# Patient Record
Sex: Female | Born: 1960 | Race: White | Hispanic: No | Marital: Married | Smoking: Former smoker
Health system: Southern US, Community
[De-identification: ages and names within clinical notes are randomized; demographics above are authoritative.]

## PROBLEM LIST (undated history)

## (undated) DIAGNOSIS — I10 Essential (primary) hypertension: Secondary | ICD-10-CM

## (undated) DIAGNOSIS — K219 Gastro-esophageal reflux disease without esophagitis: Secondary | ICD-10-CM

## (undated) HISTORY — DX: Essential (primary) hypertension: I10

## (undated) HISTORY — DX: Gastro-esophageal reflux disease without esophagitis: K21.9

---

## 2000-10-30 ENCOUNTER — Encounter: Admission: RE | Admit: 2000-10-30 | Discharge: 2000-10-30 | Payer: Self-pay | Admitting: Family Medicine

## 2000-10-30 ENCOUNTER — Encounter: Payer: Self-pay | Admitting: Family Medicine

## 2001-04-06 ENCOUNTER — Emergency Department (HOSPITAL_COMMUNITY): Admission: EM | Admit: 2001-04-06 | Discharge: 2001-04-06 | Payer: Self-pay | Admitting: Emergency Medicine

## 2002-05-26 ENCOUNTER — Encounter: Payer: Self-pay | Admitting: Family Medicine

## 2002-05-26 ENCOUNTER — Encounter: Admission: RE | Admit: 2002-05-26 | Discharge: 2002-05-26 | Payer: Self-pay | Admitting: Family Medicine

## 2005-12-19 ENCOUNTER — Other Ambulatory Visit: Admission: RE | Admit: 2005-12-19 | Discharge: 2005-12-19 | Payer: Self-pay | Admitting: Family Medicine

## 2011-01-31 ENCOUNTER — Other Ambulatory Visit (HOSPITAL_COMMUNITY)
Admission: RE | Admit: 2011-01-31 | Discharge: 2011-01-31 | Disposition: A | Discharge status: Home / Self Care | Payer: BC Managed Care – PPO | Source: Ambulatory Visit | Attending: Family Medicine | Admitting: Family Medicine

## 2011-01-31 ENCOUNTER — Other Ambulatory Visit: Payer: Self-pay | Admitting: Family Medicine

## 2011-01-31 DIAGNOSIS — Z124 Encounter for screening for malignant neoplasm of cervix: Secondary | ICD-10-CM | POA: Insufficient documentation

## 2011-01-31 DIAGNOSIS — Z1159 Encounter for screening for other viral diseases: Secondary | ICD-10-CM | POA: Insufficient documentation

## 2011-03-14 ENCOUNTER — Other Ambulatory Visit: Payer: Self-pay | Admitting: Otolaryngology

## 2011-03-19 ENCOUNTER — Other Ambulatory Visit: Payer: BC Managed Care – PPO

## 2013-10-22 ENCOUNTER — Other Ambulatory Visit: Payer: Self-pay

## 2013-10-22 DIAGNOSIS — Z1231 Encounter for screening mammogram for malignant neoplasm of breast: Secondary | ICD-10-CM

## 2013-10-30 ENCOUNTER — Ambulatory Visit
Admission: RE | Admit: 2013-10-30 | Discharge: 2013-10-30 | Disposition: A | Discharge status: Home / Self Care | Payer: BC Managed Care – PPO | Source: Ambulatory Visit

## 2013-10-30 DIAGNOSIS — Z1231 Encounter for screening mammogram for malignant neoplasm of breast: Secondary | ICD-10-CM

## 2017-02-14 ENCOUNTER — Encounter (INDEPENDENT_AMBULATORY_CARE_PROVIDER_SITE_OTHER): Payer: Self-pay

## 2017-02-22 ENCOUNTER — Other Ambulatory Visit: Payer: Self-pay | Admitting: Family Medicine

## 2017-02-22 DIAGNOSIS — R1011 Right upper quadrant pain: Secondary | ICD-10-CM

## 2017-02-25 ENCOUNTER — Ambulatory Visit (INDEPENDENT_AMBULATORY_CARE_PROVIDER_SITE_OTHER): Payer: BC Managed Care – PPO | Admitting: Family Medicine

## 2017-02-25 ENCOUNTER — Encounter (INDEPENDENT_AMBULATORY_CARE_PROVIDER_SITE_OTHER): Payer: Self-pay | Admitting: Family Medicine

## 2017-02-25 VITALS — BP 110/78 | HR 77 | Temp 97.6°F | Ht 62.0 in | Wt 264.0 lb

## 2017-02-25 DIAGNOSIS — R0602 Shortness of breath: Secondary | ICD-10-CM

## 2017-02-25 DIAGNOSIS — Z1331 Encounter for screening for depression: Secondary | ICD-10-CM | POA: Diagnosis not present

## 2017-02-25 DIAGNOSIS — R7303 Prediabetes: Secondary | ICD-10-CM | POA: Diagnosis not present

## 2017-02-25 DIAGNOSIS — R252 Cramp and spasm: Secondary | ICD-10-CM

## 2017-02-25 DIAGNOSIS — R5383 Other fatigue: Secondary | ICD-10-CM | POA: Diagnosis not present

## 2017-02-25 DIAGNOSIS — Z6841 Body Mass Index (BMI) 40.0 and over, adult: Secondary | ICD-10-CM

## 2017-02-25 DIAGNOSIS — E66813 Obesity, class 3: Secondary | ICD-10-CM

## 2017-02-25 DIAGNOSIS — Z9189 Other specified personal risk factors, not elsewhere classified: Secondary | ICD-10-CM

## 2017-02-25 DIAGNOSIS — Z0289 Encounter for other administrative examinations: Secondary | ICD-10-CM

## 2017-02-25 NOTE — Progress Notes (Signed)
.  Office: [phone]  /  Fax: [phone]   HPI:   Chief Complaint: OBESITY  Tracy Beard (MR# [id number]) is a 57 y.o. female who presents on 02/25/2017 for obesity evaluation and treatment. Current BMI is Body mass index is 48.29 kg/m.Marland Kitchen Tracy Beard has struggled with obesity for years and has been unsuccessful in either losing weight or maintaining long term weight loss. Tracy Beard attended our information session and states she is currently in the action stage of change and ready to dedicate time achieving and maintaining a healthier weight.  Tracy Beard has lost approximately 50 lbs over the last few years.   Tracy Beard states her family eats meals together she thinks her family will eat healthier with  her her desired weight loss is 134-144 lbs she has been heavy most of  her life she started gaining weight in high school her heaviest weight ever was 315 lbs. she is a picky eater and doesn't like to eat healthier foods  she has significant food cravings issues  she snacks frequently in the evenings she is frequently drinking liquids with calories she frequently makes poor food choices she has problems with excessive hunger  she frequently eats larger portions than normal  she struggles with emotional eating    Fatigue Tracy Beard feels her energy is lower than it should be. This has worsened with weight gain and has not worsened recently. Tracy Beard admits to daytime somnolence and  denies waking up still tired. Patient is at risk for obstructive sleep apnea. Patent has a history of symptoms of daytime fatigue. Patient generally gets 5 hours of sleep per night, and states they generally have generally restful sleep. Snoring is not present. Apneic episodes are present. Epworth Sleepiness Score is 20.  Dyspnea on exertion Tracy Beard notes increasing shortness of breath with exercising and seems to be worsening over time with weight gain. She notes getting out of breath sooner with  activity than she used to. This has not gotten worse recently. Tracy Beard denies orthopnea.  Pre-Diabetes Tracy Beard has a diagnosis of pre-diabetes based on her elevated Hgb A1c and was informed this puts her at greater risk of developing diabetes. She is not taking metformin currently and attempting to diet control and exercise to decrease risk of diabetes. She notes polyphagia and denies nausea or hypoglycemia.  At risk for diabetes Tracy Beard is at higher than average risk for developing diabetes due to her obesity and pre-diabetes. She currently denies polyuria or polydipsia.  Bilateral Foot Cramping Tracy Beard is on KCI and magnesium but she is still having cramping pain in feet. She notes it is worse in the evening and symptoms present for months.  Depression Screen Tracy Beard's Food and Mood (modified PHQ-9) score was  Depression screen PHQ 2/9 02/25/2017  Decreased Interest 2  Down, Depressed, Hopeless 0  PHQ - 2 Score 2  Altered sleeping 1  Tired, decreased energy 2  Change in appetite 1  Feeling bad or failure about yourself  0  Trouble concentrating 0  Moving slowly or fidgety/restless 0  PHQ-9 Score 6  Difficult doing work/chores Not difficult at all    ALLERGIES: Not on File  MEDICATIONS: Current Outpatient Medications on File Prior to Visit  Medication Sig Dispense Refill  . Calcium Carbonate-Vitamin D (CALCIUM 600+D PO) Take 2 tablets by mouth daily.    Marland Kitchen esomeprazole (NEXIUM) 40 MG capsule Take 40 mg by mouth daily at 12 noon.    . magnesium gluconate (MAGONATE) 500 MG tablet Take 500 mg by mouth daily.    Marland Kitchen  Multiple Vitamins-Minerals (MULTIVITAMIN ADULT PO) Take 1 tablet by mouth daily.    . potassium gluconate 595 (99 K) MG TABS tablet Take 595 mg by mouth daily.    . SUPER B COMPLEX/C PO Take 1 tablet by mouth daily.    . vitamin B-12 (CYANOCOBALAMIN) 100 MCG tablet Take 100 mcg by mouth daily.     No current facility-administered medications on file prior to visit.      PAST MEDICAL HISTORY: Past Medical History:  Diagnosis Date  . Acid reflux   . HTN (hypertension)     PAST SURGICAL HISTORY: History reviewed. No pertinent surgical history.  SOCIAL HISTORY: Social History   Tobacco Use  . Smoking status: Former Smoker    Packs/day: 1.00    Years: 24.00    Pack years: 24.00    Last attempt to quit: 1997    Years since quitting: 22.0  . Smokeless tobacco: Never Used  Substance Use Topics  . Alcohol use: No    Frequency: Never  . Drug use: No    FAMILY HISTORY: Family History  Problem Relation Age of Onset  . Hypertension Mother   . Hypertension Father     ROS: Review of Systems  Constitutional: Positive for malaise/fatigue. Negative for weight loss.  HENT:       Decreased hearing  Eyes:       Wear glasses or contacts  Respiratory: Positive for shortness of breath.   Cardiovascular: Negative for orthopnea.       Foot cramping (bilateral)  Gastrointestinal: Negative for nausea.  Genitourinary: Negative for frequency.  Musculoskeletal:       Muscle or joint pain  Endo/Heme/Allergies: Negative for polydipsia.       Positive polyphagia Negative hypoglycemia  Psychiatric/Behavioral: Positive for depression. Negative for suicidal ideas.    PHYSICAL EXAM: Blood pressure 110/78, pulse 77, temperature 97.6 F (36.4 C), temperature source Oral, height 5\' 2"  (1.575 m), weight 264 lb (119.7 kg), SpO2 99 %. Body mass index is 48.29 kg/m. Physical Exam  Constitutional: She is oriented to person, place, and time. She appears well-developed and well-nourished.  HENT:  Head: Normocephalic and atraumatic.  Nose: Nose normal.  Eyes: EOM are normal. No scleral icterus.  Neck: Normal range of motion. Neck supple. No thyromegaly present.  Cardiovascular: Normal rate and regular rhythm.  Pulmonary/Chest: Effort normal. No respiratory distress.  Abdominal: Soft. There is no tenderness.  + Obesity  Musculoskeletal:  Range of Motion  normal in all 4 extremities Trace edema noted in bilateral lower extremities  Neurological: She is alert and oriented to person, place, and time. Coordination normal.  Skin: Skin is warm and dry.  Psychiatric: She has a normal mood and affect. Her behavior is normal.  Vitals reviewed.   RECENT LABS AND TESTS: BMET No results found for: NA, K, CL, CO2, GLUCOSE, BUN, CREATININE, CALCIUM, GFRNONAA, GFRAA No results found for: HGBA1C No results found for: INSULIN CBC No results found for: WBC, RBC, HGB, HCT, PLT, MCV, MCH, MCHC, RDW, LYMPHSABS, MONOABS, EOSABS, BASOSABS Iron/TIBC/Ferritin/ %Sat No results found for: IRON, TIBC, FERRITIN, IRONPCTSAT Lipid Panel  No results found for: CHOL, TRIG, HDL, CHOLHDL, VLDL, LDLCALC, LDLDIRECT Hepatic Function Panel  No results found for: PROT, ALBUMIN, AST, ALT, ALKPHOS, BILITOT, BILIDIR, IBILI No results found for: TSH Vitamin D No recent labs  ECG  shows NSR with a rate of 77 BPM INDIRECT CALORIMETER done today shows a VO2 of 255 and a REE of 1777. Her calculated basal metabolic  rate is 1756 thus her basal metabolic rate is better than expected.    ASSESSMENT AND PLAN: Other fatigue - Plan: EKG 12-Lead, Vitamin B12, CBC With Differential, Folate, Lipid Panel With LDL/HDL Ratio, T3, T4, free, TSH, VITAMIN D 25 Hydroxy (Vit-D Deficiency, Fractures)  Shortness of breath on exertion - Plan: CBC With Differential, Lipid Panel With LDL/HDL Ratio  Prediabetes - Plan: Comprehensive metabolic panel, Insulin, random, Hemoglobin A1c  Foot cramps - Bilateral - Plan: Magnesium  Depression screening  At risk for diabetes mellitus  Class 3 severe obesity with serious comorbidity and body mass index (BMI) of 45.0 to 49.9 in adult, unspecified obesity type (HCC)  PLAN:  Fatigue Tracy JohnsKathleen was informed that her fatigue may be related to obesity, depression or many other causes. Labs will be ordered, and in the meanwhile Tracy JohnsKathleen has agreed to work  on diet, exercise and weight loss to help with fatigue. Proper sleep hygiene was discussed including the need for 7-8 hours of quality sleep each night. A sleep study was not ordered based on symptoms and Epworth score.  Dyspnea on exertion Tracy Beard's shortness of breath appears to be obesity related and exercise induced. She has agreed to work on weight loss and gradually increase exercise to treat her exercise induced shortness of breath. If Tracy JohnsKathleen follows our instructions and loses weight without improvement of her shortness of breath, we will plan to refer to pulmonology. We will monitor this condition regularly. Tracy JohnsKathleen agrees to this plan.  Pre-Diabetes Tracy JohnsKathleen will continue to work on weight loss, diet, exercise, and decreasing simple carbohydrates in her diet to help decrease the risk of diabetes. We dicussed metformin including benefits and risks. She was informed that eating too many simple carbohydrates or too many calories at one sitting increases the likelihood of GI side effects. Tracy JohnsKathleen declined metformin for now and a prescription was not written today. Tracy JohnsKathleen agreed to follow up with us as directed to monitor her progress. We will check labs and Tracy JohnsKathleen agrees to follow up with our clinic in 2 weeks.  Diabetes risk counselling Tracy JohnsKathleen was given extended (15 minutes) diabetes prevention counseling today. She is 57 y.o. female and has risk factors for diabetes including obesity and pre-diabetes. We discussed intensive lifestyle modifications today with an emphasis on weight loss as well as increasing exercise and decreasing simple carbohydrates in her diet.  Bilateral Foot Cramping Tracy JohnsKathleen was advised to increase H20 intake. We will check labs and Tracy JohnsKathleen agrees to follow up with our clinic in 2 weeks.  Depression Screen Tracy JohnsKathleen had a mildly positive depression screening. Depression is commonly associated with obesity and often results in emotional eating behaviors. We will  monitor this closely and work on CBT to help improve the non-hunger eating patterns. Referral to Psychology may be required if no improvement is seen as she continues in our clinic.  Obesity Tracy JohnsKathleen is currently in the action stage of change and her goal is to continue with weight loss efforts She has agreed to follow the Category 3 plan Tracy JohnsKathleen has been instructed to work up to a goal of 150 minutes of combined cardio and strengthening exercise per week for weight loss and overall health benefits. We discussed the following Behavioral Modification Strategies today: increasing lean protein intake and decreasing simple carbohydrates   Tracy JohnsKathleen has agreed to follow up with our clinic in 2 weeks. She was informed of the importance of frequent follow up visits to maximize her success with intensive lifestyle modifications for her multiple health conditions.  She was informed we would discuss her lab results at her next visit unless there is a critical issue that needs to be addressed sooner. Tracy Beard agreed to keep her next visit at the agreed upon time to discuss these results.    OBESITY BEHAVIORAL INTERVENTION VISIT  Today's visit was # 1 out of 22.  Starting weight: 264 lbs Starting date: 02/25/17 Today's weight : 264 lbs  Today's date: 02/25/2017 Total lbs lost to date: 0 (Patients must lose 7 lbs in the first 6 months to continue with counseling)   ASK: We discussed the diagnosis of obesity with [name] today and Tracy Beard agreed to give [place] permission to discuss obesity behavioral modification therapy today.  ASSESS: Tracy Beard has the diagnosis of obesity and her BMI today is 48.27 Tracy Beard is in the action stage of change   ADVISE: Tracy Beard was educated on the multiple health risks of obesity as well as the benefit of weight loss to improve her health. She was advised of the need for long term treatment and the importance of lifestyle modifications.  AGREE: Multiple  dietary modification options and treatment options were discussed and  Tracy Beard agreed to the above obesity treatment plan.   I, Burt Knack, am acting as transcriptionist for Quillian Quince, MD   I have reviewed the above documentation for accuracy and completeness, and I agree with the above. -Quillian Quince, MD

## 2017-02-26 ENCOUNTER — Encounter (INDEPENDENT_AMBULATORY_CARE_PROVIDER_SITE_OTHER): Payer: Self-pay | Admitting: Family Medicine

## 2017-02-26 LAB — CBC WITH DIFFERENTIAL
Basophils Absolute: 0 10*3/uL (ref 0.0–0.2)
Basos: 0 %
EOS (ABSOLUTE): 0.2 10*3/uL (ref 0.0–0.4)
Eos: 2 %
Hematocrit: 38.3 % (ref 34.0–46.6)
Hemoglobin: 13 g/dL (ref 11.1–15.9)
Immature Grans (Abs): 0 10*3/uL (ref 0.0–0.1)
Immature Granulocytes: 0 %
Lymphocytes Absolute: 2.1 10*3/uL (ref 0.7–3.1)
Lymphs: 23 %
MCH: 31.3 pg (ref 26.6–33.0)
MCHC: 33.9 g/dL (ref 31.5–35.7)
MCV: 92 fL (ref 79–97)
Monocytes Absolute: 0.5 10*3/uL (ref 0.1–0.9)
Monocytes: 5 %
Neutrophils Absolute: 6.7 10*3/uL (ref 1.4–7.0)
Neutrophils: 70 %
RBC: 4.16 x10E6/uL (ref 3.77–5.28)
RDW: 13.1 % (ref 12.3–15.4)
WBC: 9.5 10*3/uL (ref 3.4–10.8)

## 2017-02-26 LAB — COMPREHENSIVE METABOLIC PANEL
ALT: 10 IU/L (ref 0–32)
AST: 8 IU/L (ref 0–40)
Albumin/Globulin Ratio: 1.7 (ref 1.2–2.2)
Albumin: 4.1 g/dL (ref 3.5–5.5)
Alkaline Phosphatase: 74 IU/L (ref 39–117)
BUN/Creatinine Ratio: 20 (ref 9–23)
BUN: 24 mg/dL (ref 6–24)
Bilirubin Total: <0.2 mg/dL (ref 0.0–1.2)
CO2: 23 mmol/L (ref 20–29)
Calcium: 9.7 mg/dL (ref 8.7–10.2)
Chloride: 98 mmol/L (ref 96–106)
Creatinine, Ser: 1.21 mg/dL — ABNORMAL HIGH (ref 0.57–1.00)
GFR calc Af Amer: 57 mL/min/{1.73_m2} — ABNORMAL LOW (ref >59)
GFR calc non Af Amer: 49 mL/min/{1.73_m2} — ABNORMAL LOW (ref >59)
Globulin, Total: 2.4 g/dL (ref 1.5–4.5)
Glucose: 98 mg/dL (ref 65–99)
Potassium: 5.6 mmol/L — ABNORMAL HIGH (ref 3.5–5.2)
Sodium: 137 mmol/L (ref 134–144)
Total Protein: 6.5 g/dL (ref 6.0–8.5)

## 2017-02-26 LAB — LIPID PANEL WITH LDL/HDL RATIO
Cholesterol, Total: 152 mg/dL (ref 100–199)
HDL: 50 mg/dL (ref >39)
LDL Calculated: 83 mg/dL (ref 0–99)
LDl/HDL Ratio: 1.7 ratio (ref 0.0–3.2)
Triglycerides: 96 mg/dL (ref 0–149)
VLDL Cholesterol Cal: 19 mg/dL (ref 5–40)

## 2017-02-26 LAB — HEMOGLOBIN A1C
Est. average glucose Bld gHb Est-mCnc: 105 mg/dL
Hgb A1c MFr Bld: 5.3 % (ref 4.8–5.6)

## 2017-02-26 LAB — VITAMIN B12: Vitamin B-12: 724 pg/mL (ref 232–1245)

## 2017-02-26 LAB — MAGNESIUM: Magnesium: 1.9 mg/dL (ref 1.6–2.3)

## 2017-02-26 LAB — VITAMIN D 25 HYDROXY (VIT D DEFICIENCY, FRACTURES): Vit D, 25-Hydroxy: 46.9 ng/mL (ref 30.0–100.0)

## 2017-02-26 LAB — T4, FREE: Free T4: 1.1 ng/dL (ref 0.82–1.77)

## 2017-02-26 LAB — T3: T3, Total: 101 ng/dL (ref 71–180)

## 2017-02-26 LAB — FOLATE: Folate: >20 ng/mL (ref >3.0)

## 2017-02-26 LAB — INSULIN, RANDOM: INSULIN: 13.3 u[IU]/mL (ref 2.6–24.9)

## 2017-02-26 LAB — TSH: TSH: 3.38 u[IU]/mL (ref 0.450–4.500)

## 2017-03-03 ENCOUNTER — Encounter (INDEPENDENT_AMBULATORY_CARE_PROVIDER_SITE_OTHER): Payer: Self-pay | Admitting: Family Medicine

## 2017-03-04 ENCOUNTER — Ambulatory Visit
Admission: RE | Admit: 2017-03-04 | Discharge: 2017-03-04 | Disposition: A | Discharge status: Home / Self Care | Payer: BC Managed Care – PPO | Source: Ambulatory Visit | Attending: Family Medicine | Admitting: Family Medicine

## 2017-03-04 DIAGNOSIS — R1011 Right upper quadrant pain: Secondary | ICD-10-CM

## 2017-03-04 MED ORDER — IOPAMIDOL (ISOVUE-300) INJECTION 61%: 100.0000 mL | Freq: Once | INTRAVENOUS | Status: DC | PRN

## 2017-03-11 ENCOUNTER — Ambulatory Visit (INDEPENDENT_AMBULATORY_CARE_PROVIDER_SITE_OTHER): Payer: BC Managed Care – PPO | Admitting: Family Medicine

## 2017-03-11 VITALS — BP 113/76 | HR 82 | Temp 97.6°F | Ht 62.0 in | Wt 255.0 lb

## 2017-03-11 DIAGNOSIS — E8881 Metabolic syndrome: Secondary | ICD-10-CM | POA: Insufficient documentation

## 2017-03-11 DIAGNOSIS — E875 Hyperkalemia: Secondary | ICD-10-CM | POA: Diagnosis not present

## 2017-03-11 DIAGNOSIS — Z9189 Other specified personal risk factors, not elsewhere classified: Secondary | ICD-10-CM | POA: Diagnosis not present

## 2017-03-11 DIAGNOSIS — Z6841 Body Mass Index (BMI) 40.0 and over, adult: Secondary | ICD-10-CM | POA: Diagnosis not present

## 2017-03-11 DIAGNOSIS — N189 Chronic kidney disease, unspecified: Secondary | ICD-10-CM | POA: Diagnosis not present

## 2017-03-11 NOTE — Progress Notes (Signed)
Office: [phone]  /  Fax: [phone]   HPI:   Chief Complaint: OBESITY Tracy JohnsKathleen is here to discuss her progress with her obesity treatment plan. She is on the Category 3 plan and is following her eating plan approximately 100 % of the time. She states she is exercising 0 minutes 0 times per week. Tracy JohnsKathleen did very well with weight loss, but is not always eating all her food, especially at dinner. She likes the structure of the plan and hunger is controlled. Her weight is 255 lb (115.7 kg) today and has had a weight loss of 9 pounds over a period of 2 weeks since her last visit. She has lost 9 lbs since starting treatment with us.  Elevated Potassium Tracy JohnsKathleen was on potassium supplement for muscle cramps, and has already stopped this when she saw her lab results.  Chronic Kidney Disease Tracy JohnsKathleen has a diagnosis of chronic kidney disease with GFR below 60. She is on Lisinopril/HCTZ, but forgot to list this medication and problem on her new patient paperwork.  Insulin Resistance Tracy JohnsKathleen has a new diagnosis of insulin resistance. Her A1c and glucose are within normal limits, but her fasting insulin is elevated.  Although Tracy Beard's blood glucose readings are still under good control, insulin resistance puts her at greater risk of metabolic syndrome and diabetes. She is not taking metformin currently and continues to work on diet and exercise to decrease risk of diabetes.  At risk for diabetes Tracy JohnsKathleen is at higher than average risk for developing diabetes due to her obesity and insulin resistance. She currently denies polyuria or polydipsia.  ALLERGIES: Not on File  MEDICATIONS: Current Outpatient Medications on File Prior to Visit  Medication Sig Dispense Refill  . Calcium Carbonate-Vitamin D (CALCIUM 600+D PO) Take 2 tablets by mouth daily.    Marland Kitchen. esomeprazole (NEXIUM) 40 MG capsule Take 40 mg by mouth daily at 12 noon.    . magnesium gluconate (MAGONATE) 500 MG tablet Take 500  mg by mouth daily.    . Multiple Vitamins-Minerals (MULTIVITAMIN ADULT PO) Take 1 tablet by mouth daily.    . SUPER B COMPLEX/C PO Take 1 tablet by mouth daily.    . vitamin B-12 (CYANOCOBALAMIN) 100 MCG tablet Take 100 mcg by mouth daily.     No current facility-administered medications on file prior to visit.     PAST MEDICAL HISTORY: Past Medical History:  Diagnosis Date  . Acid reflux   . HTN (hypertension)     PAST SURGICAL HISTORY: No past surgical history on file.  SOCIAL HISTORY: Social History   Tobacco Use  . Smoking status: Former Smoker    Packs/day: 1.00    Years: 24.00    Pack years: 24.00    Last attempt to quit: 1997    Years since quitting: 22.1  . Smokeless tobacco: Never Used  Substance Use Topics  . Alcohol use: No    Frequency: Never  . Drug use: No    FAMILY HISTORY: Family History  Problem Relation Age of Onset  . Hypertension Mother   . Hypertension Father     ROS: Review of Systems  Constitutional: Positive for weight loss.  Genitourinary: Negative for frequency.  Musculoskeletal:       Positive for muscle cramps  Endo/Heme/Allergies: Negative for polydipsia.    PHYSICAL EXAM: Blood pressure 113/76, pulse 82, temperature 97.6 F (36.4 C), temperature source Oral, height 5\' 2"  (1.575 m), weight 255 lb (115.7 kg), SpO2 98 %. Body mass index is  46.64 kg/m. Physical Exam  Constitutional: She is oriented to person, place, and time. She appears well-developed and well-nourished.  Cardiovascular: Normal rate.  Pulmonary/Chest: Effort normal.  Musculoskeletal: Normal range of motion.  Neurological: She is oriented to person, place, and time.  Skin: Skin is warm and dry.  Psychiatric: She has a normal mood and affect. Her behavior is normal.  Vitals reviewed.   RECENT LABS AND TESTS: BMET    Component Value Date/Time   NA 137 02/25/2017 0938   K 5.6 (H) 02/25/2017 0938   CL 98 02/25/2017 0938   CO2 23 02/25/2017 0938   GLUCOSE  98 02/25/2017 0938   BUN 24 02/25/2017 0938   CREATININE 1.21 (H) 02/25/2017 0938   CALCIUM 9.7 02/25/2017 0938   GFRNONAA 49 (L) 02/25/2017 0938   GFRAA 57 (L) 02/25/2017 0938   Lab Results  Component Value Date   HGBA1C 5.3 02/25/2017   Lab Results  Component Value Date   INSULIN 13.3 02/25/2017   CBC    Component Value Date/Time   WBC 9.5 02/25/2017 0938   RBC 4.16 02/25/2017 0938   HGB 13.0 02/25/2017 0938   HCT 38.3 02/25/2017 0938   MCV 92 02/25/2017 0938   MCH 31.3 02/25/2017 0938   MCHC 33.9 02/25/2017 0938   RDW 13.1 02/25/2017 0938   LYMPHSABS 2.1 02/25/2017 0938   EOSABS 0.2 02/25/2017 0938   BASOSABS 0.0 02/25/2017 0938   Iron/TIBC/Ferritin/ %Sat No results found for: IRON, TIBC, FERRITIN, IRONPCTSAT Lipid Panel     Component Value Date/Time   CHOL 152 02/25/2017 0938   TRIG 96 02/25/2017 0938   HDL 50 02/25/2017 0938   LDLCALC 83 02/25/2017 0938   Hepatic Function Panel     Component Value Date/Time   PROT 6.5 02/25/2017 0938   ALBUMIN 4.1 02/25/2017 0938   AST 8 02/25/2017 0938   ALT 10 02/25/2017 0938   ALKPHOS 74 02/25/2017 0938   BILITOT <0.2 02/25/2017 0938      Component Value Date/Time   TSH 3.380 02/25/2017 0938    ASSESSMENT AND PLAN: Serum potassium elevated  Chronic kidney disease, unspecified CKD stage  Insulin resistance  At risk for diabetes mellitus  Class 3 severe obesity with serious comorbidity and body mass index (BMI) of 45.0 to 49.9 in adult, unspecified obesity type (HCC)  PLAN:  Elevated Potassium Tracy Beard will discontinue potassium supplement and we will recheck labs in 2 weeks. Tracy Beard agrees to follow up at the agreed upon time.  Chronic Kidney Disease Tracy Beard was advised to increase her H2O intake, as it is easier to dehydrate while losing weight. Tracy Beard agrees to follow up with our clinic in 2 weeks.  Insulin Resistance Tracy Beard will continue to work on weight loss, exercise, and decreasing simple  carbohydrates in her diet to help decrease the risk of diabetes. She was informed that eating too many simple carbohydrates or too many calories at one sitting increases the likelihood of GI side effects. We will defer Metformin for now and will recheck labs in 3 months. Tracy Beard agreed to follow up with [place] as directed to monitor her progress.  Diabetes risk counseling Tracy Beard was given extended (30 minutes) diabetes prevention counseling today. She is 57 y.o. female and has risk factors for diabetes including obesity and insulin resistance. We discussed intensive lifestyle modifications today with an emphasis on weight loss as well as increasing exercise and decreasing simple carbohydrates in her diet.  Obesity Tracy Beard is currently in the action stage of  change. As such, her goal is to continue with weight loss efforts She has agreed to follow the Category 3 plan Tracy Beard has been instructed to work up to a goal of 150 minutes of combined cardio and strengthening exercise per week or start back to weight lifting 20 to 30 minutes 3 to 4 times per week for weight loss and overall health benefits. We discussed the following Behavioral Modification Strategies today: no skipping meals, increasing lean protein intake and decreasing simple carbohydrates   Tracy Beard has agreed to follow up with our clinic in 2 weeks. She was informed of the importance of frequent follow up visits to maximize her success with intensive lifestyle modifications for her multiple health conditions.   OBESITY BEHAVIORAL INTERVENTION VISIT  Today's visit was # 2 out of 22.  Starting weight: 264 lbs Starting date: 02/25/17 Today's weight : 255 lbs Today's date: 03/11/2017 Total lbs lost to date: 9 (Patients must lose 7 lbs in the first 6 months to continue with counseling)   ASK: We discussed the diagnosis of obesity with Tracy Beard today and Tracy Beard agreed to give [place] permission to discuss obesity behavioral  modification therapy today.  ASSESS: Tracy Beard has the diagnosis of obesity and her BMI today is 46.63 Tracy Beard is in the action stage of change   ADVISE: Tracy Beard was educated on the multiple health risks of obesity as well as the benefit of weight loss to improve her health. She was advised of the need for long term treatment and the importance of lifestyle modifications.  AGREE: Multiple dietary modification options and treatment options were discussed and  Tracy Beard agreed to the above obesity treatment plan.  I, [place] Crane, am acting as transcriptionist for Quillian Quince, MD  I have reviewed the above documentation for accuracy and completeness, and I agree with the above. -Quillian Quince, MD

## 2017-03-26 ENCOUNTER — Ambulatory Visit (INDEPENDENT_AMBULATORY_CARE_PROVIDER_SITE_OTHER): Payer: BC Managed Care – PPO | Admitting: Family Medicine

## 2017-03-26 VITALS — BP 100/68 | HR 74 | Temp 97.8°F | Ht 62.0 in | Wt 256.0 lb

## 2017-03-26 DIAGNOSIS — Z6841 Body Mass Index (BMI) 40.0 and over, adult: Secondary | ICD-10-CM | POA: Diagnosis not present

## 2017-03-26 DIAGNOSIS — E875 Hyperkalemia: Secondary | ICD-10-CM

## 2017-03-26 NOTE — Progress Notes (Signed)
Office: [phone]  /  Fax: [phone]   HPI:   Chief Complaint: OBESITY Tracy Beard is here to discuss her progress with her obesity treatment plan. She is on the Category 3 plan and is following her eating plan approximately 99.9 % of the time. She states she is exercising 0 minutes 0 times per week. Tracy Beard is doing well maintaining weight, but is struggling to eat all her protein, especially for dinner. Her metabolism may be low due to this. Her weight is 256 lb (116.1 kg) today and has had a weight loss of 1 pound over a period of 2 weeks since her last visit. She has lost 8 lbs since starting treatment with us.  Elevated Potassium [name] potassium was elevated at her last visit. She is not on medications, which elevated potassium may be due to hydrolysis.  ALLERGIES: No Known Allergies  MEDICATIONS: Current Outpatient Medications on File Prior to Visit  Medication Sig Dispense Refill  . Calcium Carbonate-Vitamin D (CALCIUM 600+D PO) Take 2 tablets by mouth daily.    Marland Kitchen. esomeprazole (NEXIUM) 40 MG capsule Take 40 mg by mouth daily at 12 noon.    Marland Kitchen. lisinopril-hydrochlorothiazide (PRINZIDE,ZESTORETIC) 20-25 MG tablet Take 1 tablet by mouth daily.    . magnesium gluconate (MAGONATE) 500 MG tablet Take 500 mg by mouth daily.    . Multiple Vitamins-Minerals (MULTIVITAMIN ADULT PO) Take 1 tablet by mouth daily.    . SUPER B COMPLEX/C PO Take 1 tablet by mouth daily.    . vitamin B-12 (CYANOCOBALAMIN) 100 MCG tablet Take 100 mcg by mouth daily.     No current facility-administered medications on file prior to visit.     PAST MEDICAL HISTORY: Past Medical History:  Diagnosis Date  . Acid reflux   . HTN (hypertension)     PAST SURGICAL HISTORY: No past surgical history on file.  SOCIAL HISTORY: Social History   Tobacco Use  . Smoking status: Former Smoker    Packs/day: 1.00    Years: 24.00    Pack years: 24.00    Last attempt to quit: 1997    Years since quitting:  22.1  . Smokeless tobacco: Never Used  Substance Use Topics  . Alcohol use: No    Frequency: Never  . Drug use: No    FAMILY HISTORY: Family History  Problem Relation Age of Onset  . Hypertension Mother   . Hypertension Father     ROS: Review of Systems  Constitutional: Negative for weight loss.    PHYSICAL EXAM: Blood pressure 100/68, pulse 74, temperature 97.8 F (36.6 C), temperature source Oral, height 5\' 2"  (1.575 m), weight 256 lb (116.1 kg), SpO2 97 %. Body mass index is 46.82 kg/m. Physical Exam  Constitutional: She is oriented to person, place, and time. She appears well-developed and well-nourished.  Cardiovascular: Normal rate.  Pulmonary/Chest: Effort normal.  Musculoskeletal: Normal range of motion.  Neurological: She is oriented to person, place, and time.  Skin: Skin is warm and dry.  Psychiatric: She has a normal mood and affect. Her behavior is normal.  Vitals reviewed.   RECENT LABS AND TESTS: BMET    Component Value Date/Time   NA 137 02/25/2017 0938   K 5.6 (H) 02/25/2017 0938   CL 98 02/25/2017 0938   CO2 23 02/25/2017 0938   GLUCOSE 98 02/25/2017 0938   BUN 24 02/25/2017 0938   CREATININE 1.21 (H) 02/25/2017 0938   CALCIUM 9.7 02/25/2017 0938   GFRNONAA 49 (L) 02/25/2017 [id number]  GFRAA 57 (L) 02/25/2017 0938   Lab Results  Component Value Date   HGBA1C 5.3 02/25/2017   Lab Results  Component Value Date   INSULIN 13.3 02/25/2017   CBC    Component Value Date/Time   WBC 9.5 02/25/2017 0938   RBC 4.16 02/25/2017 0938   HGB 13.0 02/25/2017 0938   HCT 38.3 02/25/2017 0938   MCV 92 02/25/2017 0938   MCH 31.3 02/25/2017 0938   MCHC 33.9 02/25/2017 0938   RDW 13.1 02/25/2017 0938   LYMPHSABS 2.1 02/25/2017 0938   EOSABS 0.2 02/25/2017 0938   BASOSABS 0.0 02/25/2017 0938   Iron/TIBC/Ferritin/ %Sat No results found for: IRON, TIBC, FERRITIN, IRONPCTSAT Lipid Panel     Component Value Date/Time   CHOL 152 02/25/2017 0938   TRIG  96 02/25/2017 0938   HDL 50 02/25/2017 0938   LDLCALC 83 02/25/2017 0938   Hepatic Function Panel     Component Value Date/Time   PROT 6.5 02/25/2017 0938   ALBUMIN 4.1 02/25/2017 0938   AST 8 02/25/2017 0938   ALT 10 02/25/2017 0938   ALKPHOS 74 02/25/2017 0938   BILITOT <0.2 02/25/2017 0938      Component Value Date/Time   TSH 3.380 02/25/2017 0938    ASSESSMENT AND PLAN: Serum potassium elevated - Plan: Comprehensive metabolic panel  Class 3 severe obesity with serious comorbidity and body mass index (BMI) of 45.0 to 49.9 in adult, unspecified obesity type (HCC)  PLAN:  Elevated Potassium We will check labs and follow  Obesity Tracy Beard is currently in the action stage of change. As such, her goal is to continue with weight loss efforts She has agreed to keep a food journal with 350 to 500 calories and 35 grams of protein at supper daily and follow the Category 3 plan Tracy Beard has been instructed to work up to a goal of 150 minutes of combined cardio and strengthening exercise per week for weight loss and overall health benefits. We discussed the following Behavioral Modification Strategies today: increase H2O intake, increasing lean protein intake and increasing vegetables  Tracy Beard has agreed to follow up with our clinic in 2 weeks. She was informed of the importance of frequent follow up visits to maximize her success with intensive lifestyle modifications for her multiple health conditions.   OBESITY BEHAVIORAL INTERVENTION VISIT  Today's visit was # 3 out of 22.  Starting weight: 264 lbs Starting date: 02/25/17 Today's weight : 256 lbs Today's date: 03/26/2017 Total lbs lost to date: 8 (Patients must lose 7 lbs in the first 6 months to continue with counseling)   ASK: We discussed the diagnosis of obesity with [name] today and Tracy Beard agreed to give [place] permission to discuss obesity behavioral modification therapy today.  ASSESS: Tracy Beard has  the diagnosis of obesity and her BMI today is 46.81 Tracy Beard is in the action stage of change   ADVISE: Tracy Beard was educated on the multiple health risks of obesity as well as the benefit of weight loss to improve her health. She was advised of the need for long term treatment and the importance of lifestyle modifications.  AGREE: Multiple dietary modification options and treatment options were discussed and  Tracy Beard agreed to the above obesity treatment plan.  I, Nevada Crane, am acting as transcriptionist for Quillian Quince, MD  I have reviewed the above documentation for accuracy and completeness, and I agree with the above. -Quillian Quince, MD

## 2017-03-27 LAB — COMPREHENSIVE METABOLIC PANEL
ALT: 13 IU/L (ref 0–32)
AST: 10 IU/L (ref 0–40)
Albumin/Globulin Ratio: 1.9 (ref 1.2–2.2)
Albumin: 4 g/dL (ref 3.5–5.5)
Alkaline Phosphatase: 64 IU/L (ref 39–117)
BUN/Creatinine Ratio: 24 — ABNORMAL HIGH (ref 9–23)
BUN: 28 mg/dL — ABNORMAL HIGH (ref 6–24)
Bilirubin Total: <0.2 mg/dL (ref 0.0–1.2)
CO2: 21 mmol/L (ref 20–29)
Calcium: 8.5 mg/dL — ABNORMAL LOW (ref 8.7–10.2)
Chloride: 101 mmol/L (ref 96–106)
Creatinine, Ser: 1.18 mg/dL — ABNORMAL HIGH (ref 0.57–1.00)
GFR calc Af Amer: 59 mL/min/{1.73_m2} — ABNORMAL LOW (ref >59)
GFR calc non Af Amer: 51 mL/min/{1.73_m2} — ABNORMAL LOW (ref >59)
Globulin, Total: 2.1 g/dL (ref 1.5–4.5)
Glucose: 82 mg/dL (ref 65–99)
Potassium: 5.4 mmol/L — ABNORMAL HIGH (ref 3.5–5.2)
Sodium: 139 mmol/L (ref 134–144)
Total Protein: 6.1 g/dL (ref 6.0–8.5)

## 2017-04-10 ENCOUNTER — Ambulatory Visit (INDEPENDENT_AMBULATORY_CARE_PROVIDER_SITE_OTHER): Payer: BC Managed Care – PPO | Admitting: Family Medicine

## 2017-04-10 VITALS — BP 94/66 | HR 81 | Temp 97.7°F | Ht 62.0 in | Wt 252.0 lb

## 2017-04-10 DIAGNOSIS — Z6841 Body Mass Index (BMI) 40.0 and over, adult: Secondary | ICD-10-CM

## 2017-04-10 DIAGNOSIS — I10 Essential (primary) hypertension: Secondary | ICD-10-CM

## 2017-04-10 NOTE — Progress Notes (Signed)
Office: [phone]  /  Fax: [phone]   HPI:   Chief Complaint: OBESITY Tracy JohnsKathleen is here to discuss her progress with her obesity treatment plan. She is on the Category 3 plan and is following her eating plan approximately 100 % of the time. She states she is exercising 0 minutes 0 times per week. Tracy JohnsKathleen continues to do well with weight loss. She is tolerating the category 3 plan well. She is ready to start exercising and would like to start at her gym. Her weight is 252 lb (114.3 kg) today and has had a weight loss of 4 pounds over a period of 2 weeks since her last visit. She has lost 12 lbs since starting treatment with us.  Hypertension Tracy CouncilmanKathleen Beard is a 57 y.o. female with hypertension and is currently on lisinopril/HCTZ  Her blood pressure is low and the goal is to try to come off medications. Tracy JohnsKathleen has had a GI bug and she may be a bit dehydrated. Tracy CouncilmanKathleen Beard denies lightheadedness. She is working weight loss to help control her blood pressure with the goal of decreasing her risk of heart attack and stroke. [name] blood pressure is not currently controlled.  ALLERGIES: No Known Allergies  MEDICATIONS: Current Outpatient Medications on File Prior to Visit  Medication Sig Dispense Refill  . Calcium Carbonate-Vitamin D (CALCIUM 600+D PO) Take 2 tablets by mouth daily.    Marland Kitchen. esomeprazole (NEXIUM) 40 MG capsule Take 40 mg by mouth daily at 12 noon.    Marland Kitchen. lisinopril-hydrochlorothiazide (PRINZIDE,ZESTORETIC) 20-25 MG tablet Take 1 tablet by mouth daily.    . magnesium gluconate (MAGONATE) 500 MG tablet Take 500 mg by mouth daily.    . Multiple Vitamins-Minerals (MULTIVITAMIN ADULT PO) Take 1 tablet by mouth daily.    . SUPER B COMPLEX/C PO Take 1 tablet by mouth daily.    . vitamin B-12 (CYANOCOBALAMIN) 100 MCG tablet Take 100 mcg by mouth daily.     No current facility-administered medications on file prior to visit.     PAST MEDICAL HISTORY: Past  Medical History:  Diagnosis Date  . Acid reflux   . HTN (hypertension)     PAST SURGICAL HISTORY: No past surgical history on file.  SOCIAL HISTORY: Social History   Tobacco Use  . Smoking status: Former Smoker    Packs/day: 1.00    Years: 24.00    Pack years: 24.00    Last attempt to quit: 1997    Years since quitting: 22.2  . Smokeless tobacco: Never Used  Substance Use Topics  . Alcohol use: No    Frequency: Never  . Drug use: No    FAMILY HISTORY: Family History  Problem Relation Age of Onset  . Hypertension Mother   . Hypertension Father     ROS: Review of Systems  Constitutional: Positive for weight loss.    PHYSICAL EXAM: Blood pressure 94/66, pulse 81, temperature 97.7 F (36.5 C), temperature source Oral, height 5\' 2"  (1.575 m), weight 252 lb (114.3 kg), SpO2 99 %. Body mass index is 46.09 kg/m. Physical Exam  Constitutional: She is oriented to person, place, and time. She appears well-developed and well-nourished.  Cardiovascular: Normal rate.  Pulmonary/Chest: Effort normal.  Musculoskeletal: Normal range of motion.  Neurological: She is oriented to person, place, and time.  Skin: Skin is warm and dry.  Psychiatric: She has a normal mood and affect. Her behavior is normal.  Vitals reviewed.   RECENT LABS AND TESTS: BMET    Component  Value Date/Time   NA 139 03/26/2017 1137   K 5.4 (H) 03/26/2017 1137   CL 101 03/26/2017 1137   CO2 21 03/26/2017 1137   GLUCOSE 82 03/26/2017 1137   BUN 28 (H) 03/26/2017 1137   CREATININE 1.18 (H) 03/26/2017 1137   CALCIUM 8.5 (L) 03/26/2017 1137   GFRNONAA 51 (L) 03/26/2017 1137   GFRAA 59 (L) 03/26/2017 1137   Lab Results  Component Value Date   HGBA1C 5.3 02/25/2017   Lab Results  Component Value Date   INSULIN 13.3 02/25/2017   CBC    Component Value Date/Time   WBC 9.5 02/25/2017 0938   RBC 4.16 02/25/2017 0938   HGB 13.0 02/25/2017 0938   HCT 38.3 02/25/2017 0938   MCV 92 02/25/2017  0938   MCH 31.3 02/25/2017 0938   MCHC 33.9 02/25/2017 0938   RDW 13.1 02/25/2017 0938   LYMPHSABS 2.1 02/25/2017 0938   EOSABS 0.2 02/25/2017 0938   BASOSABS 0.0 02/25/2017 0938   Iron/TIBC/Ferritin/ %Sat No results found for: IRON, TIBC, FERRITIN, IRONPCTSAT Lipid Panel     Component Value Date/Time   CHOL 152 02/25/2017 0938   TRIG 96 02/25/2017 0938   HDL 50 02/25/2017 0938   LDLCALC 83 02/25/2017 0938   Hepatic Function Panel     Component Value Date/Time   PROT 6.1 03/26/2017 1137   ALBUMIN 4.0 03/26/2017 1137   AST 10 03/26/2017 1137   ALT 13 03/26/2017 1137   ALKPHOS 64 03/26/2017 1137   BILITOT <0.2 03/26/2017 1137      Component Value Date/Time   TSH 3.380 02/25/2017 1610    ASSESSMENT AND PLAN: Essential hypertension  Class 3 severe obesity with serious comorbidity and body mass index (BMI) of 45.0 to 49.9 in adult, unspecified obesity type (HCC)  PLAN:  Hypertension We discussed sodium restriction, working on healthy weight loss, and a regular exercise program as the means to achieve improved blood pressure control. Tracy Beard is to increase H2O intake and continue her medications as prescribed. Tracy Beard agreed with this plan and agreed to follow up as directed. We will recheck her blood pressure in 3 weeks and will continue to monitor her blood pressure as well as her progress with the above lifestyle modifications. We may need to decrease her dose at that time.  We spent > than 50% of the 15 minute visit on the counseling as documented in the note.  Obesity Tracy Beard is currently in the action stage of change. As such, her goal is to continue with weight loss efforts She has agreed to follow the Category 3 plan Tracy Beard has been instructed to work up to a goal of 150 minutes of combined cardio and strengthening exercise or 45 to 60 minutes of cardio/strengthening 3 times per week per week for weight loss and overall health benefits. We discussed the  following Behavioral Modification Strategies today: increasing lean protein intake and increase H2O intake  Tracy Beard has agreed to follow up with our clinic in 3 weeks. She was informed of the importance of frequent follow up visits to maximize her success with intensive lifestyle modifications for her multiple health conditions.   OBESITY BEHAVIORAL INTERVENTION VISIT  Today's visit was # 4 out of 22.  Starting weight: 264 lbs Starting date: 02/25/17 Today's weight : 252 lbs Today's date: 04/10/2017 Total lbs lost to date: 12 (Patients must lose 7 lbs in the first 6 months to continue with counseling)   ASK: We discussed the diagnosis of obesity with  Tracy Beard today and Tracy Beard agreed to give [place] permission to discuss obesity behavioral modification therapy today.  ASSESS: Tracy Beard has the diagnosis of obesity and her BMI today is 46.08 Tracy Beard is in the action stage of change   ADVISE: Tracy Beard was educated on the multiple health risks of obesity as well as the benefit of weight loss to improve her health. She was advised of the need for long term treatment and the importance of lifestyle modifications.  AGREE: Multiple dietary modification options and treatment options were discussed and  Tracy Beard agreed to the above obesity treatment plan.  I, Nevada Crane, am acting as transcriptionist for Quillian Quince, MD  I have reviewed the above documentation for accuracy and completeness, and I agree with the above. -Quillian Quince, MD

## 2017-05-06 ENCOUNTER — Ambulatory Visit (INDEPENDENT_AMBULATORY_CARE_PROVIDER_SITE_OTHER): Payer: BC Managed Care – PPO | Admitting: Family Medicine

## 2017-05-06 VITALS — BP 86/60 | HR 66 | Temp 97.9°F | Ht 62.0 in | Wt 249.0 lb

## 2017-05-06 DIAGNOSIS — I10 Essential (primary) hypertension: Secondary | ICD-10-CM

## 2017-05-06 DIAGNOSIS — Z6841 Body Mass Index (BMI) 40.0 and over, adult: Secondary | ICD-10-CM

## 2017-05-06 DIAGNOSIS — Z9189 Other specified personal risk factors, not elsewhere classified: Secondary | ICD-10-CM

## 2017-05-06 NOTE — Progress Notes (Signed)
Office: [phone]  /  Fax: [phone]   HPI:   Chief Complaint: OBESITY Tracy JohnsKathleen is here to discuss her progress with her obesity treatment plan. She is on the Category 3 plan and is following her eating plan approximately 95 % of the time. She states she is exercising 0 minutes 0 times per week. Tracy JohnsKathleen continues to do well with weight loss. She notes some sugar cravings but is able to eat 100 calorie snacks.  Her weight is 249 lb (112.9 kg) today and has had a weight loss of 3 pounds over a period of 3 to 4 weeks since her last visit. She has lost 15 lbs since starting treatment with us.  Hypertension Tracy CouncilmanKathleen Beard is a 57 y.o. female with hypertension. Tracy's blood pressure continues to decrease with weight loss, she denies feeling lightheaded or dizzy yet, working on increasing H20. She is working weight loss to help control her blood pressure with the goal of decreasing her risk of heart attack and stroke. Tracy's blood pressure is not currently controlled.  At risk for cardiovascular disease Tracy JohnsKathleen is at a higher than average risk for cardiovascular disease due to obesity and hypertension. She currently denies any chest pain.  ALLERGIES: No Known Allergies  MEDICATIONS: Current Outpatient Medications on File Prior to Visit  Medication Sig Dispense Refill  . Calcium Carbonate-Vitamin D (CALCIUM 600+D PO) Take 2 tablets by mouth daily.    Marland Kitchen. esomeprazole (NEXIUM) 40 MG capsule Take 40 mg by mouth daily at 12 noon.    . magnesium gluconate (MAGONATE) 500 MG tablet Take 500 mg by mouth daily.    . Multiple Vitamins-Minerals (MULTIVITAMIN ADULT PO) Take 1 tablet by mouth daily.    . SUPER B COMPLEX/C PO Take 1 tablet by mouth daily.    . vitamin B-12 (CYANOCOBALAMIN) 100 MCG tablet Take 100 mcg by mouth daily.     No current facility-administered medications on file prior to visit.     PAST MEDICAL HISTORY: Past Medical History:  Diagnosis Date  . Acid  reflux   . HTN (hypertension)     PAST SURGICAL HISTORY: No past surgical history on file.  SOCIAL HISTORY: Social History   Tobacco Use  . Smoking status: Former Smoker    Packs/day: 1.00    Years: 24.00    Pack years: 24.00    Last attempt to quit: 1997    Years since quitting: 22.2  . Smokeless tobacco: Never Used  Substance Use Topics  . Alcohol use: No    Frequency: Never  . Drug use: No    FAMILY HISTORY: Family History  Problem Relation Age of Onset  . Hypertension Mother   . Hypertension Father     ROS: Review of Systems  Constitutional: Positive for weight loss.  Cardiovascular: Negative for chest pain.  Neurological: Negative for dizziness.       Negative lightheadedness    PHYSICAL EXAM: Blood pressure (!) 86/60, pulse 66, temperature 97.9 F (36.6 C), temperature source Oral, height 5\' 2"  (1.575 m), weight 249 lb (112.9 kg), SpO2 95 %. Body mass index is 45.54 kg/m. Physical Exam  Constitutional: She is oriented to person, place, and time. She appears well-developed and well-nourished.  Cardiovascular: Normal rate.  Pulmonary/Chest: Effort normal.  Musculoskeletal: Normal range of motion.  Neurological: She is oriented to person, place, and time.  Skin: Skin is warm and dry.  Psychiatric: She has a normal mood and affect. Her behavior is normal.  Vitals reviewed.  RECENT LABS AND TESTS: BMET    Component Value Date/Time   NA 139 03/26/2017 1137   K 5.4 (H) 03/26/2017 1137   CL 101 03/26/2017 1137   CO2 21 03/26/2017 1137   GLUCOSE 82 03/26/2017 1137   BUN 28 (H) 03/26/2017 1137   CREATININE 1.18 (H) 03/26/2017 1137   CALCIUM 8.5 (L) 03/26/2017 1137   GFRNONAA 51 (L) 03/26/2017 1137   GFRAA 59 (L) 03/26/2017 1137   Lab Results  Component Value Date   HGBA1C 5.3 02/25/2017   Lab Results  Component Value Date   INSULIN 13.3 02/25/2017   CBC    Component Value Date/Time   WBC 9.5 02/25/2017 0938   RBC 4.16 02/25/2017 0938    HGB 13.0 02/25/2017 0938   HCT 38.3 02/25/2017 0938   MCV 92 02/25/2017 0938   MCH 31.3 02/25/2017 0938   MCHC 33.9 02/25/2017 0938   RDW 13.1 02/25/2017 0938   LYMPHSABS 2.1 02/25/2017 0938   EOSABS 0.2 02/25/2017 0938   BASOSABS 0.0 02/25/2017 0938   Iron/TIBC/Ferritin/ %Sat No results found for: IRON, TIBC, FERRITIN, IRONPCTSAT Lipid Panel     Component Value Date/Time   CHOL 152 02/25/2017 0938   TRIG 96 02/25/2017 0938   HDL 50 02/25/2017 0938   LDLCALC 83 02/25/2017 0938   Hepatic Function Panel     Component Value Date/Time   PROT 6.1 03/26/2017 1137   ALBUMIN 4.0 03/26/2017 1137   AST 10 03/26/2017 1137   ALT 13 03/26/2017 1137   ALKPHOS 64 03/26/2017 1137   BILITOT <0.2 03/26/2017 1137      Component Value Date/Time   TSH 3.380 02/25/2017 1610    ASSESSMENT AND PLAN: Essential hypertension  At risk for heart disease  Class 3 severe obesity with serious comorbidity and body mass index (BMI) of 45.0 to 49.9 in adult, unspecified obesity type (HCC)  PLAN:  Hypertension We discussed sodium restriction, working on healthy weight loss, and a regular exercise program as the means to achieve improved blood pressure control. Tracy Beard agreed with this plan and agreed to follow up as directed. We will continue to monitor her blood pressure as well as her progress with the above lifestyle modifications. Tracy Beard agrees to discontinue lisinopril-hydrochlorothiazide and continue diet, exercise, and she will watch for signs of hypotension as she continues her lifestyle modifications. Tracy Beard agrees to follow up with our clinic in 3 weeks and we will recheck blood pressure at that time.  Cardiovascular risk counselling Tracy Beard was given extended (15 minutes) coronary artery disease prevention counseling today. She is 57 y.o. female and has risk factors for heart disease including obesity and hypertension. We discussed intensive lifestyle modifications today with an emphasis  on specific weight loss instructions and strategies. Pt was also informed of the importance of increasing exercise and decreasing saturated fats to help prevent heart disease.  Obesity Tracy Beard is currently in the action stage of change. As such, her goal is to continue with weight loss efforts She has agreed to follow the Category 3 plan Tracy Beard has been instructed to work up to a goal of 150 minutes of combined cardio and strengthening exercise per week or cardio for 15 minutes 5 times per week for weight loss and overall health benefits. We discussed the following Behavioral Modification Strategies today: increasing lean protein intake, decreasing simple carbohydrates, increase H20 intake    Tracy Beard has agreed to follow up with our clinic in 3 weeks. She was informed of the importance of frequent  follow up visits to maximize her success with intensive lifestyle modifications for her multiple health conditions.   OBESITY BEHAVIORAL INTERVENTION VISIT  Today's visit was # 5 out of 22.  Starting weight: 264 lbs Starting date: 02/25/17 Today's weight : 249 lbs Today's date: 05/06/2017 Total lbs lost to date: 15 (Patients must lose 7 lbs in the first 6 months to continue with counseling)   ASK: We discussed the diagnosis of obesity with Tracy Beard today and Tracy Beard agreed to give [place] permission to discuss obesity behavioral modification therapy today.  ASSESS: Tracy Beard has the diagnosis of obesity and her BMI today is 45.53 Tracy Beard is in the action stage of change   ADVISE: Tracy Beard was educated on the multiple health risks of obesity as well as the benefit of weight loss to improve her health. She was advised of the need for long term treatment and the importance of lifestyle modifications.  AGREE: Multiple dietary modification options and treatment options were discussed and  Tracy Beard agreed to the above obesity treatment plan.  I, Burt Knack, am acting as  transcriptionist for Quillian Quince, MD  I have reviewed the above documentation for accuracy and completeness, and I agree with the above. -Quillian Quince, MD

## 2017-05-08 ENCOUNTER — Encounter (INDEPENDENT_AMBULATORY_CARE_PROVIDER_SITE_OTHER): Payer: Self-pay | Admitting: Family Medicine

## 2017-05-27 ENCOUNTER — Ambulatory Visit (INDEPENDENT_AMBULATORY_CARE_PROVIDER_SITE_OTHER): Payer: BC Managed Care – PPO | Admitting: Family Medicine

## 2017-05-27 VITALS — BP 101/70 | HR 84 | Temp 98.0°F | Ht 62.0 in | Wt 247.0 lb

## 2017-05-27 DIAGNOSIS — Z6841 Body Mass Index (BMI) 40.0 and over, adult: Secondary | ICD-10-CM | POA: Diagnosis not present

## 2017-05-27 DIAGNOSIS — K219 Gastro-esophageal reflux disease without esophagitis: Secondary | ICD-10-CM | POA: Diagnosis not present

## 2017-05-28 NOTE — Progress Notes (Signed)
Office: [phone]  /  Fax: [phone]   HPI:   Chief Complaint: OBESITY Tracy Beard is here to discuss her progress with her obesity treatment plan. She is on the Category 3 plan and is following her eating plan approximately 30 % of the time. She states she is is on the treadmill and lifting weights for 15 minutes 4 times per week. Tracy Beard continues to do well with weight loss. She has had increased challenges with increased eating out and some celebration eating. She has gone 3 weeks since last visit and feels she does better at 2 week visits.  Her weight is 247 lb (112 kg) today and has had a weight loss of 2 pounds over a period of 3 weeks since her last visit. She has lost 17 lbs since starting treatment with [place].  GERD Tracy Beard is on Nexium. Controlled with no abdominal pain and she denies history of Barrett's.  ALLERGIES: No Known Allergies  MEDICATIONS: Current Outpatient Medications on File Prior to Visit  Medication Sig Dispense Refill  . Calcium Carbonate-Vitamin D (CALCIUM 600+D PO) Take 2 tablets by mouth daily.    Marland Kitchen esomeprazole (NEXIUM) 40 MG capsule Take 40 mg by mouth daily at 12 noon.    . Multiple Vitamins-Minerals (MULTIVITAMIN ADULT PO) Take 1 tablet by mouth daily.    . magnesium gluconate (MAGONATE) 500 MG tablet Take 500 mg by mouth daily.     No current facility-administered medications on file prior to visit.     PAST MEDICAL HISTORY: Past Medical History:  Diagnosis Date  . Acid reflux   . HTN (hypertension)     PAST SURGICAL HISTORY: No past surgical history on file.  SOCIAL HISTORY: Social History   Tobacco Use  . Smoking status: Former Smoker    Packs/day: 1.00    Years: 24.00    Pack years: 24.00    Last attempt to quit: 1997    Years since quitting: 22.3  . Smokeless tobacco: Never Used  Substance Use Topics  . Alcohol use: No    Frequency: Never  . Drug use: No    FAMILY HISTORY: Family History  Problem Relation Age of  Onset  . Hypertension Mother   . Hypertension Father     ROS: Review of Systems  Constitutional: Positive for weight loss.  Gastrointestinal: Negative for abdominal pain.    PHYSICAL EXAM: Blood pressure 101/70, pulse 84, temperature 98 F (36.7 C), temperature source Oral, height  (1.575 m), weight 247 lb (112 kg), SpO2 96 %. Body mass index is 45.18 kg/m. Physical Exam  Constitutional: She is oriented to person, place, and time. She appears well-developed and well-nourished.  Cardiovascular: Normal rate.  Pulmonary/Chest: Effort normal.  Musculoskeletal: Normal range of motion.  Neurological: She is oriented to person, place, and time.  Skin: Skin is warm and dry.  Psychiatric: She has a normal mood and affect. Her behavior is normal.  Vitals reviewed.   RECENT LABS AND TESTS: BMET    Component Value Date/Time   NA 139 03/26/2017 1137   K 5.4 (H) 03/26/2017 1137   CL 101 03/26/2017 1137   CO2 21 03/26/2017 1137   GLUCOSE 82 03/26/2017 1137   BUN 28 (H) 03/26/2017 1137   CREATININE 1.18 (H) 03/26/2017 1137   CALCIUM 8.5 (L) 03/26/2017 1137   GFRNONAA 51 (L) 03/26/2017 1137   GFRAA 59 (L) 03/26/2017 1137   Lab Results  Component Value Date   HGBA1C 5.3 02/25/2017   Lab Results  Component Value Date   INSULIN 13.3 02/25/2017   CBC    Component Value Date/Time   WBC 9.5 02/25/2017 0938   RBC 4.16 02/25/2017 0938   HGB 13.0 02/25/2017 0938   HCT 38.3 02/25/2017 0938   MCV 92 02/25/2017 0938   MCH 31.3 02/25/2017 0938   MCHC 33.9 02/25/2017 0938   RDW 13.1 02/25/2017 0938   LYMPHSABS 2.1 02/25/2017 0938   EOSABS 0.2 02/25/2017 0938   BASOSABS 0.0 02/25/2017 0938   Iron/TIBC/Ferritin/ %Sat No results found for: IRON, TIBC, FERRITIN, IRONPCTSAT Lipid Panel     Component Value Date/Time   CHOL 152 02/25/2017 0938   TRIG 96 02/25/2017 0938   HDL 50 02/25/2017 0938   LDLCALC 83 02/25/2017 0938   Hepatic Function Panel     Component Value  Date/Time   PROT 6.1 03/26/2017 1137   ALBUMIN 4.0 03/26/2017 1137   AST 10 03/26/2017 1137   ALT 13 03/26/2017 1137   ALKPHOS 64 03/26/2017 1137   BILITOT <0.2 03/26/2017 1137      Component Value Date/Time   TSH 3.380 02/25/2017 5284    ASSESSMENT AND PLAN: Chronic GERD  Class 3 severe obesity without serious comorbidity with body mass index (BMI) of 45.0 to 49.9 in adult, unspecified obesity type (HCC)  PLAN:  GERD Tracy Beard will continue taking Nexium and continue diet, exercise, and weight loss. Tracy Beard agrees to follow up with our clinic in 2 weeks.  We spent > than 50% of the 15 minute visit on the counseling as documented in the note.  Obesity Tracy Beard is currently in the action stage of change. As such, her goal is to continue with weight loss efforts She has agreed to follow the Category 3 plan Tracy Beard has been instructed to work up to a goal of 150 minutes of combined cardio and strengthening exercise per week for weight loss and overall health benefits. We discussed the following Behavioral Modification Strategies today: increasing lean protein intake, decreasing simple carbohydrates  and work on meal planning and easy cooking plans   Tracy Beard has agreed to follow up with our clinic in 2 weeks. She was informed of the importance of frequent follow up visits to maximize her success with intensive lifestyle modifications for her multiple health conditions.   OBESITY BEHAVIORAL INTERVENTION VISIT  Today's visit was # 6 out of 22.  Starting weight: 264 lbs Starting date: 02/25/17 Today's weight : 247 lbs Today's date: 05/27/2017 Total lbs lost to date: 17 (Patients must lose 7 lbs in the first 6 months to continue with counseling)   ASK: We discussed the diagnosis of obesity with [name] today and Tracy Beard agreed to give [place] permission to discuss obesity behavioral modification therapy today.  ASSESS: Tracy Beard has the diagnosis of obesity and her  BMI today is 45.17 Tracy Beard is in the action stage of change   ADVISE: Tracy Beard was educated on the multiple health risks of obesity as well as the benefit of weight loss to improve her health. She was advised of the need for long term treatment and the importance of lifestyle modifications.  AGREE: Multiple dietary modification options and treatment options were discussed and  Tracy Beard agreed to the above obesity treatment plan.  I, Burt Knack, am acting as transcriptionist for Quillian Quince, MD  I have reviewed the above documentation for accuracy and completeness, and I agree with the above. -Quillian Quince, MD

## 2017-06-13 ENCOUNTER — Ambulatory Visit (INDEPENDENT_AMBULATORY_CARE_PROVIDER_SITE_OTHER): Payer: BC Managed Care – PPO | Admitting: Family Medicine

## 2017-06-13 VITALS — BP 103/70 | HR 74 | Temp 97.5°F | Ht 62.0 in | Wt 248.0 lb

## 2017-06-13 DIAGNOSIS — I10 Essential (primary) hypertension: Secondary | ICD-10-CM

## 2017-06-13 DIAGNOSIS — Z6841 Body Mass Index (BMI) 40.0 and over, adult: Secondary | ICD-10-CM | POA: Diagnosis not present

## 2017-06-13 DIAGNOSIS — E66813 Obesity, class 3: Secondary | ICD-10-CM

## 2017-06-13 NOTE — Progress Notes (Signed)
Office: [phone]  /  Fax: [phone]   HPI:   Chief Complaint: OBESITY Tracy Beard is here to discuss her progress with her obesity treatment plan. She is on the Category 3 plan and is following her eating plan approximately 50 % of the time. She states she is doing cardio and weights for 30-50 minutes 3-4 times per week. Tracy Beard has gotten off track with traveling and visiting her mother but is back on track this last week. She hasn't been eating all her food and this is likely decreased her BMR.  Her weight is 248 lb (112.5 kg) today and has gained 1 pound since her last visit. She has lost 16 lbs since starting treatment with [place].  Hypertension Charish Schroepfer is a 57 y.o. female with hypertension. Tracy Beard discontinued her blood pressure medications due to hypotension last visit but blood pressure is elevated to 140's systolic so she restarted. Blood pressure controlled and not as low. She denies headache or lightheadedness. She is working weight loss to help control her blood pressure with the goal of decreasing her risk of heart attack and stroke. [name]'s blood pressure is currently controlled.  ALLERGIES: No Known Allergies  MEDICATIONS: Current Outpatient Medications on File Prior to Visit  Medication Sig Dispense Refill  . Calcium Carbonate-Vitamin D (CALCIUM 600+D PO) Take 2 tablets by mouth daily.    Marland Kitchen esomeprazole (NEXIUM) 40 MG capsule Take 40 mg by mouth daily at 12 noon.    Marland Kitchen lisinopril-hydrochlorothiazide (PRINZIDE,ZESTORETIC) 20-25 MG tablet Take 1 tablet by mouth daily.    . magnesium gluconate (MAGONATE) 500 MG tablet Take 500 mg by mouth daily.    . Multiple Vitamins-Minerals (MULTIVITAMIN ADULT PO) Take 1 tablet by mouth daily.     No current facility-administered medications on file prior to visit.     PAST MEDICAL HISTORY: Past Medical History:  Diagnosis Date  . Acid reflux   . HTN (hypertension)     PAST SURGICAL HISTORY: No past surgical  history on file.  SOCIAL HISTORY: Social History   Tobacco Use  . Smoking status: Former Smoker    Packs/day: 1.00    Years: 24.00    Pack years: 24.00    Last attempt to quit: 1997    Years since quitting: 22.3  . Smokeless tobacco: Never Used  Substance Use Topics  . Alcohol use: No    Frequency: Never  . Drug use: No    FAMILY HISTORY: Family History  Problem Relation Age of Onset  . Hypertension Mother   . Hypertension Father     ROS: Review of Systems  Constitutional: Negative for weight loss.  Neurological: Negative for headaches.       Negative lightheadedness    PHYSICAL EXAM: Blood pressure 103/70, pulse 74, temperature (!) 97.5 F (36.4 C), temperature source Oral, height  (1.575 m), weight 248 lb (112.5 kg), SpO2 98 %. Body mass index is 45.36 kg/m. Physical Exam  Constitutional: She is oriented to person, place, and time. She appears well-developed and well-nourished.  Cardiovascular: Normal rate.  Pulmonary/Chest: Effort normal.  Musculoskeletal: Normal range of motion.  Neurological: She is oriented to person, place, and time.  Skin: Skin is warm and dry.  Psychiatric: She has a normal mood and affect. Her behavior is normal.  Vitals reviewed.   RECENT LABS AND TESTS: BMET    Component Value Date/Time   NA 139 03/26/2017 1137   K 5.4 (H) 03/26/2017 1137   CL 101 03/26/2017 1137  CO2 21 03/26/2017 1137   GLUCOSE 82 03/26/2017 1137   BUN 28 (H) 03/26/2017 1137   CREATININE 1.18 (H) 03/26/2017 1137   CALCIUM 8.5 (L) 03/26/2017 1137   GFRNONAA 51 (L) 03/26/2017 1137   GFRAA 59 (L) 03/26/2017 1137   Lab Results  Component Value Date   HGBA1C 5.3 02/25/2017   Lab Results  Component Value Date   INSULIN 13.3 02/25/2017   CBC    Component Value Date/Time   WBC 9.5 02/25/2017 0938   RBC 4.16 02/25/2017 0938   HGB 13.0 02/25/2017 0938   HCT 38.3 02/25/2017 0938   MCV 92 02/25/2017 0938   MCH 31.3 02/25/2017 0938   MCHC 33.9  02/25/2017 0938   RDW 13.1 02/25/2017 0938   LYMPHSABS 2.1 02/25/2017 0938   EOSABS 0.2 02/25/2017 0938   BASOSABS 0.0 02/25/2017 0938   Iron/TIBC/Ferritin/ %Sat No results found for: IRON, TIBC, FERRITIN, IRONPCTSAT Lipid Panel     Component Value Date/Time   CHOL 152 02/25/2017 0938   TRIG 96 02/25/2017 0938   HDL 50 02/25/2017 0938   LDLCALC 83 02/25/2017 0938   Hepatic Function Panel     Component Value Date/Time   PROT 6.1 03/26/2017 1137   ALBUMIN 4.0 03/26/2017 1137   AST 10 03/26/2017 1137   ALT 13 03/26/2017 1137   ALKPHOS 64 03/26/2017 1137   BILITOT <0.2 03/26/2017 1137      Component Value Date/Time   TSH 3.380 02/25/2017 4132    ASSESSMENT AND PLAN: Essential hypertension  Class 3 severe obesity with serious comorbidity and body mass index (BMI) of 45.0 to 49.9 in adult, unspecified obesity type (HCC)  PLAN:  Hypertension We discussed sodium restriction, working on healthy weight loss, and a regular exercise program as the means to achieve improved blood pressure control. Tracy Beard agreed with this plan and agreed to follow up as directed. We will continue to monitor her blood pressure as well as her progress with the above lifestyle modifications. Tracy Beard agrees to continue lisinopril-hydrochlorothiazide and diet, may decrease dose after more weight loss. She will watch for signs of hypotension as she continues her lifestyle modifications. Tracy Beard agrees to follow up with our clinic in 2 weeks.  We spent > than 50% of the 15 minute visit on the counseling as documented in the note.  Obesity Tracy Beard is currently in the action stage of change. As such, her goal is to continue with weight loss efforts She has agreed to follow the Category 3 plan Tracy Beard has been instructed to work up to a goal of 150 minutes of combined cardio and strengthening exercise per week for weight loss and overall health benefits. We discussed the following Behavioral  Modification Strategies today: increasing lean protein intake and decreasing simple carbohydrates    Tracy Beard has agreed to follow up with our clinic in 2 weeks. She was informed of the importance of frequent follow up visits to maximize her success with intensive lifestyle modifications for her multiple health conditions.   OBESITY BEHAVIORAL INTERVENTION VISIT  Today's visit was # 7 out of 22.  Starting weight: 264 lbs Starting date: 02/25/17 Today's weight : 248 lbs  Today's date: 06/13/2017 Total lbs lost to date: 16 (Patients must lose 7 lbs in the first 6 months to continue with counseling)   ASK: We discussed the diagnosis of obesity with [name] today and Tracy Beard agreed to give [place] permission to discuss obesity behavioral modification therapy today.  ASSESS: Tracy Beard has the diagnosis of  obesity and her BMI today is 45.35 Tracy Beard is in the action stage of change   ADVISE: Tracy Beard was educated on the multiple health risks of obesity as well as the benefit of weight loss to improve her health. She was advised of the need for long term treatment and the importance of lifestyle modifications.  AGREE: Multiple dietary modification options and treatment options were discussed and  Tracy Beard agreed to the above obesity treatment plan.  I, Burt Knack, am acting as transcriptionist for Quillian Quince, MD  I have reviewed the above documentation for accuracy and completeness, and I agree with the above. -Quillian Quince, MD

## 2017-06-27 ENCOUNTER — Ambulatory Visit (INDEPENDENT_AMBULATORY_CARE_PROVIDER_SITE_OTHER): Payer: BC Managed Care – PPO | Admitting: Family Medicine

## 2017-06-27 VITALS — BP 84/61 | HR 74 | Temp 97.7°F | Ht 62.0 in | Wt 245.0 lb

## 2017-06-27 DIAGNOSIS — I10 Essential (primary) hypertension: Secondary | ICD-10-CM

## 2017-06-27 DIAGNOSIS — Z6841 Body Mass Index (BMI) 40.0 and over, adult: Secondary | ICD-10-CM

## 2017-06-27 NOTE — Progress Notes (Signed)
Office: [phone]  /  Fax: [phone]   HPI:   Chief Complaint: OBESITY Tracy Beard is here to discuss her progress with her obesity treatment plan. She is on the Category 3 plan and is following her eating plan approximately 85-90 % of the time. She states she is doing cardio and weight lifting for 20-40 minutes 4 times per week. Tracy Beard continues to do well with Category 3 plan. Planning ahead is going well and hunger is controlled. She exercises regularly.  Her weight is 245 lb (111.1 kg) today and has had a weight loss of 3 pounds over a period of 2 weeks since her last visit. She has lost 19 lbs since starting treatment with [place].  Hypertension Tracy Beard is a 57 y.o. female with hypertension. Tracy Beard's blood pressure is low again but she is asymptomatic, we tried discontinue lisinopril/hydrochlorothiazide in the past but blood pressure elevated over 140/90 so we restarted. She states blood pressure is low in the morning but elevated at her job. She denies chest pain. She is working weight loss to help control her blood pressure with the goal of decreasing her risk of heart attack and stroke. Tracy Beard's blood pressure is not currently controlled.  ALLERGIES: No Known Allergies  MEDICATIONS: Current Outpatient Medications on File Prior to Visit  Medication Sig Dispense Refill  . Calcium Carbonate-Vitamin D (CALCIUM 600+D PO) Take 2 tablets by mouth daily.    . Cholecalciferol (VITAMIN D3) 2000 units TABS Take 1 capsule by mouth daily.    Marland Kitchen esomeprazole (NEXIUM) 40 MG capsule Take 40 mg by mouth daily at 12 noon.    Marland Kitchen lisinopril-hydrochlorothiazide (PRINZIDE,ZESTORETIC) 20-25 MG tablet Take 1 tablet by mouth daily.    . magnesium gluconate (MAGONATE) 500 MG tablet Take 500 mg by mouth daily.    . Multiple Vitamins-Minerals (MULTIVITAMIN ADULT PO) Take 1 tablet by mouth daily.     No current facility-administered medications on file prior to visit.     PAST MEDICAL  HISTORY: Past Medical History:  Diagnosis Date  . Acid reflux   . HTN (hypertension)     PAST SURGICAL HISTORY: No past surgical history on file.  SOCIAL HISTORY: Social History   Tobacco Use  . Smoking status: Former Smoker    Packs/day: 1.00    Years: 24.00    Pack years: 24.00    Last attempt to quit: 1997    Years since quitting: 22.4  . Smokeless tobacco: Never Used  Substance Use Topics  . Alcohol use: No    Frequency: Never  . Drug use: No    FAMILY HISTORY: Family History  Problem Relation Age of Onset  . Hypertension Mother   . Hypertension Father     ROS: Review of Systems  Constitutional: Positive for weight loss.  Cardiovascular: Negative for chest pain.    PHYSICAL EXAM: Blood pressure (!) 84/61, pulse 74, temperature 97.7 F (36.5 C), temperature source Oral, height  (1.575 m), weight 245 lb (111.1 kg), SpO2 96 %. Body mass index is 44.81 kg/m. Physical Exam  Constitutional: She is oriented to person, place, and time. She appears well-developed and well-nourished.  Cardiovascular: Normal rate.  Pulmonary/Chest: Effort normal.  Musculoskeletal: Normal range of motion.  Neurological: She is oriented to person, place, and time.  Skin: Skin is warm and dry.  Psychiatric: She has a normal mood and affect. Her behavior is normal.  Vitals reviewed.   RECENT LABS AND TESTS: BMET    Component Value Date/Time  NA 139 03/26/2017 1137   K 5.4 (H) 03/26/2017 1137   CL 101 03/26/2017 1137   CO2 21 03/26/2017 1137   GLUCOSE 82 03/26/2017 1137   BUN 28 (H) 03/26/2017 1137   CREATININE 1.18 (H) 03/26/2017 1137   CALCIUM 8.5 (L) 03/26/2017 1137   GFRNONAA 51 (L) 03/26/2017 1137   GFRAA 59 (L) 03/26/2017 1137   Lab Results  Component Value Date   HGBA1C 5.3 02/25/2017   Lab Results  Component Value Date   INSULIN 13.3 02/25/2017   CBC    Component Value Date/Time   WBC 9.5 02/25/2017 0938   RBC 4.16 02/25/2017 0938   HGB 13.0  02/25/2017 0938   HCT 38.3 02/25/2017 0938   MCV 92 02/25/2017 0938   MCH 31.3 02/25/2017 0938   MCHC 33.9 02/25/2017 0938   RDW 13.1 02/25/2017 0938   LYMPHSABS 2.1 02/25/2017 0938   EOSABS 0.2 02/25/2017 0938   BASOSABS 0.0 02/25/2017 0938   Iron/TIBC/Ferritin/ %Sat No results found for: IRON, TIBC, FERRITIN, IRONPCTSAT Lipid Panel     Component Value Date/Time   CHOL 152 02/25/2017 0938   TRIG 96 02/25/2017 0938   HDL 50 02/25/2017 0938   LDLCALC 83 02/25/2017 0938   Hepatic Function Panel     Component Value Date/Time   PROT 6.1 03/26/2017 1137   ALBUMIN 4.0 03/26/2017 1137   AST 10 03/26/2017 1137   ALT 13 03/26/2017 1137   ALKPHOS 64 03/26/2017 1137   BILITOT <0.2 03/26/2017 1137      Component Value Date/Time   TSH 3.380 02/25/2017 0454    ASSESSMENT AND PLAN: Essential hypertension  Class 3 severe obesity with serious comorbidity and body mass index (BMI) of 45.0 to 49.9 in adult, unspecified obesity type (HCC)  PLAN:  Hypertension We discussed sodium restriction, working on healthy weight loss, and a regular exercise program as the means to achieve improved blood pressure control. Tracy Beard agreed with this plan and agreed to follow up as directed. We will continue to monitor her blood pressure as well as her progress with the above lifestyle modifications. Tracy Beard agrees to continue taking lisinopril/hydrochlorothiazide and will continue to monitor closely. She will watch for signs of hypotension as she continues her lifestyle modifications. Tracy Beard agrees to follow up with our clinic in 2 to 3 weeks.  We spent > than 50% of the 15 minute visit on the counseling as documented in the note.  Obesity Tracy Beard is currently in the action stage of change. As such, her goal is to continue with weight loss efforts She has agreed to follow the Category 3 plan Tracy Beard has been instructed to work up to a goal of 150 minutes of combined cardio and strengthening  exercise per week for weight loss and overall health benefits. We discussed the following Behavioral Modification Strategies today: increasing lean protein intake, decreasing simple carbohydrates, increase H20 intake, and work on meal planning and easy cooking plans   Tracy Beard has agreed to follow up with our clinic in 2 to 3 weeks. She was informed of the importance of frequent follow up visits to maximize her success with intensive lifestyle modifications for her multiple health conditions.   OBESITY BEHAVIORAL INTERVENTION VISIT  Today's visit was # 8 out of 22.  Starting weight: 264 lbs Starting date: 02/25/17 Today's weight : 245 lbs Today's date: 06/27/2017 Total lbs lost to date: 46 (Patients must lose 7 lbs in the first 6 months to continue with counseling)   ASK: We  discussed the diagnosis of obesity with [name] today and Tracy Beard agreed to give [place] permission to discuss obesity behavioral modification therapy today.  ASSESS: Tracy Beard has the diagnosis of obesity and her BMI today is 21.8 Tracy Beard is in the action stage of change   ADVISE: Tracy Beard was educated on the multiple health risks of obesity as well as the benefit of weight loss to improve her health. She was advised of the need for long term treatment and the importance of lifestyle modifications.  AGREE: Multiple dietary modification options and treatment options were discussed and  Tracy Beard agreed to the above obesity treatment plan.  I, Burt Knack, am acting as transcriptionist for Quillian Quince, MD  I have reviewed the above documentation for accuracy and completeness, and I agree with the above. -Quillian Quince, MD

## 2017-07-11 ENCOUNTER — Ambulatory Visit (INDEPENDENT_AMBULATORY_CARE_PROVIDER_SITE_OTHER): Payer: BC Managed Care – PPO | Admitting: Family Medicine

## 2017-07-11 VITALS — BP 99/69 | HR 69 | Temp 98.1°F | Ht 62.0 in | Wt 244.0 lb

## 2017-07-11 DIAGNOSIS — Z9189 Other specified personal risk factors, not elsewhere classified: Secondary | ICD-10-CM | POA: Diagnosis not present

## 2017-07-11 DIAGNOSIS — Z6841 Body Mass Index (BMI) 40.0 and over, adult: Secondary | ICD-10-CM

## 2017-07-11 DIAGNOSIS — E559 Vitamin D deficiency, unspecified: Secondary | ICD-10-CM | POA: Diagnosis not present

## 2017-07-11 DIAGNOSIS — F3289 Other specified depressive episodes: Secondary | ICD-10-CM | POA: Diagnosis not present

## 2017-07-11 DIAGNOSIS — E8881 Metabolic syndrome: Secondary | ICD-10-CM

## 2017-07-11 DIAGNOSIS — E88819 Insulin resistance, unspecified: Secondary | ICD-10-CM

## 2017-07-11 DIAGNOSIS — E66813 Obesity, class 3: Secondary | ICD-10-CM

## 2017-07-11 MED ORDER — BUPROPION HCL ER (SR) 150 MG PO TB12: 150.0000 mg | ORAL_TABLET | Freq: Every day | ORAL | 0 refills | Status: DC

## 2017-07-11 NOTE — Progress Notes (Signed)
Office: [phone]  /  Fax: [phone]   HPI:   Chief Complaint: OBESITY Tracy Beard is here to discuss her progress with her obesity treatment plan. She is on the Category 3 plan and is following her eating plan approximately 75 % of the time. She states she is doing cardio and weights for 45 minutes 4 times per week. Tracy Beard continues to lose weight but struggling with increased evening emotional eating and would like to discuss strategies to help with this.  Her weight is 244 lb (110.7 kg) today and has had a weight loss of 1 pound over a period of 2 weeks since her last visit. She has lost 20 lbs since starting treatment with [place].  Vitamin D Deficiency Tracy Beard has a diagnosis of vitamin D deficiency. She is on OTC Vit D, not yet at goal. She denies nausea, vomiting or muscle weakness.  Insulin Resistance Tracy Beard has a diagnosis of insulin resistance based on her elevated fasting insulin level >5. Although Tracy Beard's blood glucose readings are still under good control, insulin resistance puts her at greater risk of metabolic syndrome and diabetes. She is not on metformin, attempting to control with diet to decrease risk of diabetes.  At risk for diabetes Tracy Beard is at higher than average risk for developing diabetes due to her obesity and insulin resistance. She currently denies polyuria or polydipsia.  Depression with emotional eating behaviors Tracy Beard notes increased emotional eating, worse in the evening. Tracy Beard struggles with emotional eating and using food for comfort to the extent that it is negatively impacting her health. She often snacks when she is not hungry. Tracy Beard sometimes feels she is out of control and then feels guilty that she made poor food choices. She has been working on behavior modification techniques to help reduce her emotional eating and has been somewhat successful. She shows no sign of suicidal or homicidal ideations.  Depression screen [facility] 2/9  02/25/2017  Decreased Interest 2  Down, Depressed, Hopeless 0  PHQ - 2 Score 2  Altered sleeping 1  Tired, decreased energy 2  Change in appetite 1  Feeling bad or failure about yourself  0  Trouble concentrating 0  Moving slowly or fidgety/restless 0  PHQ-9 Score 6  Difficult doing work/chores Not difficult at all    ALLERGIES: No Known Allergies  MEDICATIONS: Current Outpatient Medications on File Prior to Visit  Medication Sig Dispense Refill  . Calcium Carbonate-Vitamin D (CALCIUM 600+D PO) Take 2 tablets by mouth daily.    . Cholecalciferol (VITAMIN D3) 2000 units TABS Take 1 capsule by mouth daily.    . diphenhydrAMINE (SOMINEX) 25 MG tablet Take 25 mg by mouth at bedtime as needed for sleep.    Marland Kitchen esomeprazole (NEXIUM) 40 MG capsule Take 40 mg by mouth daily at 12 noon.    Marland Kitchen lisinopril-hydrochlorothiazide (PRINZIDE,ZESTORETIC) 20-25 MG tablet Take 1 tablet by mouth daily.    . magnesium gluconate (MAGONATE) 500 MG tablet Take 500 mg by mouth daily.    . Multiple Vitamins-Minerals (MULTIVITAMIN ADULT PO) Take 1 tablet by mouth daily.     No current facility-administered medications on file prior to visit.     PAST MEDICAL HISTORY: Past Medical History:  Diagnosis Date  . Acid reflux   . HTN (hypertension)     PAST SURGICAL HISTORY: No past surgical history on file.  SOCIAL HISTORY: Social History   Tobacco Use  . Smoking status: Former Smoker    Packs/day: 1.00    Years: 24.00  Pack years: 24.00    Last attempt to quit: 1997    Years since quitting: 22.4  . Smokeless tobacco: Never Used  Substance Use Topics  . Alcohol use: No    Frequency: Never  . Drug use: No    FAMILY HISTORY: Family History  Problem Relation Age of Onset  . Hypertension Mother   . Hypertension Father     ROS: Review of Systems  Constitutional: Positive for weight loss.  Gastrointestinal: Negative for nausea and vomiting.  Genitourinary: Negative for frequency.    Musculoskeletal:       Negative muscle weakness  Endo/Heme/Allergies: Negative for polydipsia.  Psychiatric/Behavioral: Positive for depression. Negative for suicidal ideas.    PHYSICAL EXAM: Blood pressure 99/69, pulse 69, temperature 98.1 F (36.7 C), temperature source Oral, height 5\' 2"  (1.575 m), weight 244 lb (110.7 kg), SpO2 98 %. Body mass index is 44.63 kg/m. Physical Exam  Constitutional: She is oriented to person, place, and time. She appears well-developed and well-nourished.  Cardiovascular: Normal rate.  Pulmonary/Chest: Effort normal.  Musculoskeletal: Normal range of motion.  Neurological: She is oriented to person, place, and time.  Skin: Skin is warm and dry.  Psychiatric: She has a normal mood and affect. Her behavior is normal.  Vitals reviewed.   RECENT LABS AND TESTS: BMET    Component Value Date/Time   NA 139 03/26/2017 1137   K 5.4 (H) 03/26/2017 1137   CL 101 03/26/2017 1137   CO2 21 03/26/2017 1137   GLUCOSE 82 03/26/2017 1137   BUN 28 (H) 03/26/2017 1137   CREATININE 1.18 (H) 03/26/2017 1137   CALCIUM 8.5 (L) 03/26/2017 1137   GFRNONAA 51 (L) 03/26/2017 1137   GFRAA 59 (L) 03/26/2017 1137   Lab Results  Component Value Date   HGBA1C 5.3 02/25/2017   Lab Results  Component Value Date   INSULIN 13.3 02/25/2017   CBC    Component Value Date/Time   WBC 9.5 02/25/2017 0938   RBC 4.16 02/25/2017 0938   HGB 13.0 02/25/2017 0938   HCT 38.3 02/25/2017 0938   MCV 92 02/25/2017 0938   MCH 31.3 02/25/2017 0938   MCHC 33.9 02/25/2017 0938   RDW 13.1 02/25/2017 0938   LYMPHSABS 2.1 02/25/2017 0938   EOSABS 0.2 02/25/2017 0938   BASOSABS 0.0 02/25/2017 0938   Iron/TIBC/Ferritin/ %Sat No results found for: IRON, TIBC, FERRITIN, IRONPCTSAT Lipid Panel     Component Value Date/Time   CHOL 152 02/25/2017 0938   TRIG 96 02/25/2017 0938   HDL 50 02/25/2017 0938   LDLCALC 83 02/25/2017 0938   Hepatic Function Panel     Component Value  Date/Time   PROT 6.1 03/26/2017 1137   ALBUMIN 4.0 03/26/2017 1137   AST 10 03/26/2017 1137   ALT 13 03/26/2017 1137   ALKPHOS 64 03/26/2017 1137   BILITOT <0.2 03/26/2017 1137      Component Value Date/Time   TSH 3.380 02/25/2017 0938  Results for Geryl CouncilmanKIRKPATRICK, Tracy Beard (MRN [id number]) as of 07/11/2017 10:10  Ref. Range 02/25/2017 09:38  Vitamin D, 25-Hydroxy Latest Ref Range: 30.0 - 100.0 ng/mL 46.9    ASSESSMENT AND PLAN: Vitamin D deficiency - Plan: VITAMIN D 25 Hydroxy (Vit-D Deficiency, Fractures)  Insulin resistance - Plan: Comprehensive metabolic panel, Hemoglobin A1c, Insulin, random, Lipid Panel With LDL/HDL Ratio, Vitamin B12, Folate  Other depression - with emotional eating - Plan: buPROPion (WELLBUTRIN SR) 150 MG 12 hr tablet  At risk for diabetes mellitus  Class 3 severe  obesity with serious comorbidity and body mass index (BMI) of 40.0 to 44.9 in adult, unspecified obesity type (HCC)  PLAN:  Vitamin D Deficiency Tracy Beard was informed that low vitamin D levels contributes to fatigue and are associated with obesity, breast, and colon cancer. Tracy Beard agrees to continue taking OTC Vit D and will follow up for routine testing of vitamin D, at least 2-3 times per year. She was informed of the risk of over-replacement of vitamin D and agrees to not increase her dose unless she discusses this with [place] first. We will check labs and Tracy Beard agrees to follow up with our clinic in 2 to 3 weeks.  Insulin Resistance Tracy Beard will continue to work on weight loss, exercise, and decreasing simple carbohydrates in her diet to help decrease the risk of diabetes. We dicussed metformin including benefits and risks. She was informed that eating too many simple carbohydrates or too many calories at one sitting increases the likelihood of GI side effects. Tracy Beard declined metformin for now and prescription was not written today. We will check labs and Tracy Beard agrees to follow up with our  clinic in 2 to 3 weeks as directed to monitor her progress.  Diabetes risk counselling Tracy Beard was given extended (15 minutes) diabetes prevention counseling today. She is 57 y.o. female and has risk factors for diabetes including obesity and insulin resistance. We discussed intensive lifestyle modifications today with an emphasis on weight loss as well as increasing exercise and decreasing simple carbohydrates in her diet.  Depression with Emotional Eating Behaviors We discussed behavior modification techniques today to help Tracy Beard deal with her emotional eating and depression. Tracy Beard agrees to start Wellbutrin SR 150 mg q AM #30 with no refills. Tracy Beard agrees to follow up with our clinic in 2 to 3 weeks.  Obesity Tracy Beard is currently in the action stage of change. As such, her goal is to continue with weight loss efforts She has agreed to follow the Category 3 plan Tracy Beard has been instructed to work up to a goal of 150 minutes of combined cardio and strengthening exercise per week for weight loss and overall health benefits. We discussed the following Behavioral Modification Strategies today: increasing lean protein intake, decreasing simple carbohydrates  and emotional eating strategies   Tracy Beard has agreed to follow up with our clinic in 2 to 3 weeks. She was informed of the importance of frequent follow up visits to maximize her success with intensive lifestyle modifications for her multiple health conditions.   OBESITY BEHAVIORAL INTERVENTION VISIT  Today's visit was # 9 out of 22.  Starting weight: 264 lbs Starting date: 02/25/17 Today's weight : 244 lbs Today's date: 07/11/2017 Total lbs lost to date: 20 (Patients must lose 7 lbs in the first 6 months to continue with counseling)   ASK: We discussed the diagnosis of obesity with [name] today and Tracy Beard agreed to give [place] permission to discuss obesity behavioral modification therapy  today.  ASSESS: Tracy Beard has the diagnosis of obesity and her BMI today is 44.62 Tracy Beard is in the action stage of change   ADVISE: Tracy Beard was educated on the multiple health risks of obesity as well as the benefit of weight loss to improve her health. She was advised of the need for long term treatment and the importance of lifestyle modifications.  AGREE: Multiple dietary modification options and treatment options were discussed and  Tracy Beard agreed to the above obesity treatment plan.  Trude Mcburney, am acting as Energy manager for [employer],  MD  I have reviewed the above documentation for accuracy and completeness, and I agree with the above. -Dennard Nip, MD

## 2017-07-12 LAB — LIPID PANEL WITH LDL/HDL RATIO
Cholesterol, Total: 168 mg/dL (ref 100–199)
HDL: 47 mg/dL (ref >39)
LDL Calculated: 101 mg/dL — ABNORMAL HIGH (ref 0–99)
LDl/HDL Ratio: 2.1 ratio (ref 0.0–3.2)
Triglycerides: 100 mg/dL (ref 0–149)
VLDL Cholesterol Cal: 20 mg/dL (ref 5–40)

## 2017-07-12 LAB — COMPREHENSIVE METABOLIC PANEL
ALT: 12 IU/L (ref 0–32)
AST: 11 IU/L (ref 0–40)
Albumin/Globulin Ratio: 1.8 (ref 1.2–2.2)
Albumin: 4.1 g/dL (ref 3.5–5.5)
Alkaline Phosphatase: 64 IU/L (ref 39–117)
BUN/Creatinine Ratio: 31 — ABNORMAL HIGH (ref 9–23)
BUN: 36 mg/dL — ABNORMAL HIGH (ref 6–24)
Bilirubin Total: 0.3 mg/dL (ref 0.0–1.2)
CO2: 23 mmol/L (ref 20–29)
Calcium: 9.6 mg/dL (ref 8.7–10.2)
Chloride: 103 mmol/L (ref 96–106)
Creatinine, Ser: 1.18 mg/dL — ABNORMAL HIGH (ref 0.57–1.00)
GFR calc Af Amer: 59 mL/min/{1.73_m2} — ABNORMAL LOW (ref >59)
GFR calc non Af Amer: 51 mL/min/{1.73_m2} — ABNORMAL LOW (ref >59)
Globulin, Total: 2.3 g/dL (ref 1.5–4.5)
Glucose: 95 mg/dL (ref 65–99)
Potassium: 4.4 mmol/L (ref 3.5–5.2)
Sodium: 141 mmol/L (ref 134–144)
Total Protein: 6.4 g/dL (ref 6.0–8.5)

## 2017-07-12 LAB — VITAMIN D 25 HYDROXY (VIT D DEFICIENCY, FRACTURES): Vit D, 25-Hydroxy: 58.9 ng/mL (ref 30.0–100.0)

## 2017-07-12 LAB — VITAMIN B12: Vitamin B-12: 812 pg/mL (ref 232–1245)

## 2017-07-12 LAB — HEMOGLOBIN A1C
Est. average glucose Bld gHb Est-mCnc: 105 mg/dL
Hgb A1c MFr Bld: 5.3 % (ref 4.8–5.6)

## 2017-07-12 LAB — INSULIN, RANDOM: INSULIN: 8.5 u[IU]/mL (ref 2.6–24.9)

## 2017-07-12 LAB — FOLATE: Folate: >20 ng/mL (ref >3.0)

## 2017-07-18 ENCOUNTER — Ambulatory Visit (INDEPENDENT_AMBULATORY_CARE_PROVIDER_SITE_OTHER): Payer: BC Managed Care – PPO | Admitting: Family Medicine

## 2017-07-29 ENCOUNTER — Ambulatory Visit (INDEPENDENT_AMBULATORY_CARE_PROVIDER_SITE_OTHER): Payer: BC Managed Care – PPO | Admitting: Family Medicine

## 2017-07-29 VITALS — BP 87/63 | HR 78 | Temp 97.8°F | Ht 62.0 in | Wt 242.0 lb

## 2017-07-29 DIAGNOSIS — Z9189 Other specified personal risk factors, not elsewhere classified: Secondary | ICD-10-CM

## 2017-07-29 DIAGNOSIS — E860 Dehydration: Secondary | ICD-10-CM | POA: Diagnosis not present

## 2017-07-29 DIAGNOSIS — Z6841 Body Mass Index (BMI) 40.0 and over, adult: Secondary | ICD-10-CM | POA: Diagnosis not present

## 2017-07-29 DIAGNOSIS — I10 Essential (primary) hypertension: Secondary | ICD-10-CM | POA: Diagnosis not present

## 2017-07-29 DIAGNOSIS — F3289 Other specified depressive episodes: Secondary | ICD-10-CM

## 2017-07-29 MED ORDER — BUPROPION HCL ER (SR) 150 MG PO TB12: 150.0000 mg | ORAL_TABLET | Freq: Every day | ORAL | 0 refills | Status: DC

## 2017-07-30 NOTE — Progress Notes (Signed)
Office: [phone]  /  Fax: [phone]   HPI:   Chief Complaint: OBESITY Tracy Beard is here to discuss her progress with her obesity treatment plan. She is on the Category 3 plan and is following her eating plan approximately 80 % of the time. She states she is exercising 0 minutes 0 times per week. Tracy Beard continues to do well with weight loss even with having to increase eating out while working on [employer]. She is doing well with portion control and smarter food choices when eating out.  Her weight is 242 lb (109.8 kg) today and has had a weight loss of 2 pounds over a period of 2 to 3 weeks since her last visit. She has lost 22 lbs since starting treatment with [place].  Hypertension Tracy Beard is a 57 y.o. female with hypertension. Tracy Beard's blood pressure continues to be low but she states blood pressure runs approximately 110/70 at her primary care physician office. She denies fatigue, lightheadedness, or syncopal episodes even with working outside in the heat. She is working weight loss to help control her blood pressure with the goal of decreasing her risk of heart attack and stroke. Tracy Beard's blood pressure is not currently controlled.  At risk for cardiovascular disease Tracy Beard is at a higher than average risk for cardiovascular disease due to obesity and hypertension. She currently denies any chest pain.  Dehydration Tracy Beard's BUN is elevated and she is outside in the heat a lot this summer, drinking approximately 60 oz liquids per day.  Depression with emotional eating behaviors Tracy Beard's mood is stable on Wellbutrin, decreased emotional eating and no elevated blood pressure. Tracy Beard struggles with emotional eating and using food for comfort to the extent that it is negatively impacting her health. She often snacks when she is not hungry. Tracy Beard sometimes feels she is out of control and then feels guilty that she made poor food choices. She has been  working on behavior modification techniques to help reduce her emotional eating and has been somewhat successful. She shows no sign of suicidal or homicidal ideations.  Depression screen [facility] 2/9 02/25/2017  Decreased Interest 2  Down, Depressed, Hopeless 0  PHQ - 2 Score 2  Altered sleeping 1  Tired, decreased energy 2  Change in appetite 1  Feeling bad or failure about yourself  0  Trouble concentrating 0  Moving slowly or fidgety/restless 0  PHQ-9 Score 6  Difficult doing work/chores Not difficult at all    ALLERGIES: No Known Allergies  MEDICATIONS: Current Outpatient Medications on File Prior to Visit  Medication Sig Dispense Refill  . Calcium Carbonate-Vitamin D (CALCIUM 600+D PO) Take 2 tablets by mouth daily.    . Cholecalciferol (VITAMIN D3) 2000 units TABS Take 1 capsule by mouth daily.    . diphenhydrAMINE (SOMINEX) 25 MG tablet Take 25 mg by mouth at bedtime as needed for sleep.    Marland Kitchen esomeprazole (NEXIUM) 40 MG capsule Take 40 mg by mouth daily at 12 noon.    Marland Kitchen lisinopril-hydrochlorothiazide (PRINZIDE,ZESTORETIC) 20-25 MG tablet Take 1 tablet by mouth daily.    . magnesium gluconate (MAGONATE) 500 MG tablet Take 500 mg by mouth daily.    . Multiple Vitamins-Minerals (MULTIVITAMIN ADULT PO) Take 1 tablet by mouth daily.     No current facility-administered medications on file prior to visit.     PAST MEDICAL HISTORY: Past Medical History:  Diagnosis Date  . Acid reflux   . HTN (hypertension)     PAST SURGICAL HISTORY: No  past surgical history on file.  SOCIAL HISTORY: Social History   Tobacco Use  . Smoking status: Former Smoker    Packs/day: 1.00    Years: 24.00    Pack years: 24.00    Last attempt to quit: 1997    Years since quitting: 22.5  . Smokeless tobacco: Never Used  Substance Use Topics  . Alcohol use: No    Frequency: Never  . Drug use: No    FAMILY HISTORY: Family History  Problem Relation Age of Onset  . Hypertension Mother   .  Hypertension Father     ROS: Review of Systems  Constitutional: Positive for weight loss. Negative for malaise/fatigue.  Cardiovascular: Negative for chest pain.  Neurological:       Negative lightheadedness Negative syncopal episodes  Psychiatric/Behavioral: Positive for depression. Negative for suicidal ideas.    PHYSICAL EXAM: Blood pressure (!) 87/63, pulse 78, temperature 97.8 F (36.6 C), temperature source Oral, height 5\' 2"  (1.575 m), weight 242 lb (109.8 kg), SpO2 98 %. Body mass index is 44.26 kg/m. Physical Exam  Constitutional: She is oriented to person, place, and time. She appears well-developed and well-nourished.  Cardiovascular: Normal rate.  Pulmonary/Chest: Effort normal.  Musculoskeletal: Normal range of motion.  Neurological: She is oriented to person, place, and time.  Skin: Skin is warm and dry.  Psychiatric: She has a normal mood and affect. Her behavior is normal.  Vitals reviewed.   RECENT LABS AND TESTS: BMET    Component Value Date/Time   NA 141 07/11/2017 0819   K 4.4 07/11/2017 0819   CL 103 07/11/2017 0819   CO2 23 07/11/2017 0819   GLUCOSE 95 07/11/2017 0819   BUN 36 (H) 07/11/2017 0819   CREATININE 1.18 (H) 07/11/2017 0819   CALCIUM 9.6 07/11/2017 0819   GFRNONAA 51 (L) 07/11/2017 0819   GFRAA 59 (L) 07/11/2017 0819   Lab Results  Component Value Date   HGBA1C 5.3 07/11/2017   HGBA1C 5.3 02/25/2017   Lab Results  Component Value Date   INSULIN 8.5 07/11/2017   INSULIN 13.3 02/25/2017   CBC    Component Value Date/Time   WBC 9.5 02/25/2017 0938   RBC 4.16 02/25/2017 0938   HGB 13.0 02/25/2017 0938   HCT 38.3 02/25/2017 0938   MCV 92 02/25/2017 0938   MCH 31.3 02/25/2017 0938   MCHC 33.9 02/25/2017 0938   RDW 13.1 02/25/2017 0938   LYMPHSABS 2.1 02/25/2017 0938   EOSABS 0.2 02/25/2017 0938   BASOSABS 0.0 02/25/2017 0938   Iron/TIBC/Ferritin/ %Sat No results found for: IRON, TIBC, FERRITIN, IRONPCTSAT Lipid Panel      Component Value Date/Time   CHOL 168 07/11/2017 0819   TRIG 100 07/11/2017 0819   HDL 47 07/11/2017 0819   LDLCALC 101 (H) 07/11/2017 0819   Hepatic Function Panel     Component Value Date/Time   PROT 6.4 07/11/2017 0819   ALBUMIN 4.1 07/11/2017 0819   AST 11 07/11/2017 0819   ALT 12 07/11/2017 0819   ALKPHOS 64 07/11/2017 0819   BILITOT 0.3 07/11/2017 0819      Component Value Date/Time   TSH 3.380 02/25/2017 0938    ASSESSMENT AND PLAN: Essential hypertension  Dehydration  Other depression - with emotional eating - Plan: buPROPion (WELLBUTRIN SR) 150 MG 12 hr tablet  At risk for heart disease  Class 3 severe obesity with serious comorbidity and body mass index (BMI) of 40.0 to 44.9 in adult, unspecified obesity type (HCC)  PLAN:  Hypertension We discussed sodium restriction, working on healthy weight loss, and a regular exercise program as the means to achieve improved blood pressure control. Tracy JohnsKathleen agreed with this plan and agreed to follow up as directed. We will continue to monitor her blood pressure as well as her progress with the above lifestyle modifications. Tracy JohnsKathleen agrees to continue her medications as is and will watch for signs of hypotension as she continues her lifestyle modifications. Tracy JohnsKathleen agrees to follow up with our clinic in 3 weeks.  Cardiovascular risk counselling Tracy JohnsKathleen was given extended (15 minutes) coronary artery disease prevention counseling today. She is 57 y.o. female and has risk factors for heart disease including obesity and hypertension. We discussed intensive lifestyle modifications today with an emphasis on specific weight loss instructions and strategies. Pt was also informed of the importance of increasing exercise and decreasing saturated fats to help prevent heart disease.  Dehydration Tracy JohnsKathleen is to increase H20 to at least 100 oz per day while out in the heat. Tracy JohnsKathleen agrees to follow up with our clinic in 3  weeks.  Depression with Emotional Eating Behaviors We discussed behavior modification techniques today to help Tracy JohnsKathleen deal with her emotional eating and depression. Tracy JohnsKathleen agrees to continue taking Wellbutrin SR 150 mg qd #30 and we will refill for 1 month. Tracy JohnsKathleen agrees to follow up with our clinic in 3 weeks.  Obesity Tracy JohnsKathleen is currently in the action stage of change. As such, her goal is to continue with weight loss efforts She has agreed to follow the Category 3 plan Tracy JohnsKathleen has been instructed to work up to a goal of 150 minutes of combined cardio and strengthening exercise per week for weight loss and overall health benefits. We discussed the following Behavioral Modification Strategies today: increasing lean protein intake, decreasing simple carbohydrates, work on meal planning and easy cooking plans, and emotional eating strategies   Tracy JohnsKathleen has agreed to follow up with our clinic in 3 weeks. She was informed of the importance of frequent follow up visits to maximize her success with intensive lifestyle modifications for her multiple health conditions.   OBESITY BEHAVIORAL INTERVENTION VISIT  Today's visit was # 10 out of 22.  Starting weight: 264 lbs Starting date: 02/25/17 Today's weight : 242 lbs  Today's date: 07/29/2017 Total lbs lost to date: 22 (Patients must lose 7 lbs in the first 6 months to continue with counseling)   ASK: We discussed the diagnosis of obesity with [name] Severa today and Tracy JohnsKathleen agreed to give us permission to discuss obesity behavioral modification therapy today.  ASSESS: Tracy JohnsKathleen has the diagnosis of obesity and her BMI today is 44.25 Tracy JohnsKathleen is in the action stage of change   ADVISE: Tracy JohnsKathleen was educated on the multiple health risks of obesity as well as the benefit of weight loss to improve her health. She was advised of the need for long term treatment and the importance of lifestyle modifications.  AGREE: Multiple  dietary modification options and treatment options were discussed and  Tracy JohnsKathleen agreed to the above obesity treatment plan.  I, Burt KnackSharon Martin, am acting as transcriptionist for Quillian Quincearen Beasley, MD  I have reviewed the above documentation for accuracy and completeness, and I agree with the above. -Quillian Quincearen Beasley, MD

## 2017-08-19 ENCOUNTER — Ambulatory Visit (INDEPENDENT_AMBULATORY_CARE_PROVIDER_SITE_OTHER): Payer: BC Managed Care – PPO | Admitting: Family Medicine

## 2017-08-19 VITALS — BP 112/64 | HR 79 | Temp 97.9°F | Ht 62.0 in | Wt 262.0 lb

## 2017-08-19 DIAGNOSIS — R600 Localized edema: Secondary | ICD-10-CM

## 2017-08-19 DIAGNOSIS — Z6841 Body Mass Index (BMI) 40.0 and over, adult: Secondary | ICD-10-CM

## 2017-08-20 NOTE — Progress Notes (Signed)
Office: [phone]  /  Fax: [phone]   HPI:   Chief Complaint: OBESITY Tracy Beard is here to discuss her progress with her obesity treatment plan. She is on the Category 3 plan and is following her eating plan approximately 0 % of the time. She states she is walking 45 minutes 3 to 4 times per week. Tracy Beard has been on a cruise and had some celebration eating, but a 20 lb weight gain over 3 weeks is completely unexpected. Her weight is 262 lb (118.8 kg) today and has had a weight gain of 20 pounds over a period of 3 weeks since her last visit. She has lost 2 lbs since starting treatment with [place].  Lower Extremity Edema Patient notes increased bilateral ankle edema while traveling. She denies chest pain or shortness of breath at rest. She denies orthopnea.  ALLERGIES: No Known Allergies  MEDICATIONS: Current Outpatient Medications on File Prior to Visit  Medication Sig Dispense Refill  . buPROPion (WELLBUTRIN SR) 150 MG 12 hr tablet Take 1 tablet (150 mg total) by mouth daily. 30 tablet 0  . Calcium Carbonate-Vitamin D (CALCIUM 600+D PO) Take 2 tablets by mouth daily.    . Cholecalciferol (VITAMIN D3) 2000 units TABS Take 1 capsule by mouth daily.    . diphenhydrAMINE (SOMINEX) 25 MG tablet Take 25 mg by mouth at bedtime as needed for sleep.    Marland Kitchen esomeprazole (NEXIUM) 40 MG capsule Take 40 mg by mouth daily at 12 noon.    Marland Kitchen lisinopril-hydrochlorothiazide (PRINZIDE,ZESTORETIC) 20-25 MG tablet Take 1 tablet by mouth daily.    . magnesium gluconate (MAGONATE) 500 MG tablet Take 500 mg by mouth daily.    . Multiple Vitamins-Minerals (MULTIVITAMIN ADULT PO) Take 1 tablet by mouth daily.     No current facility-administered medications on file prior to visit.     PAST MEDICAL HISTORY: Past Medical History:  Diagnosis Date  . Acid reflux   . HTN (hypertension)     PAST SURGICAL HISTORY: No past surgical history on file.  SOCIAL HISTORY: Social History   Tobacco Use  .  Smoking status: Former Smoker    Packs/day: 1.00    Years: 24.00    Pack years: 24.00    Last attempt to quit: 1997    Years since quitting: 22.5  . Smokeless tobacco: Never Used  Substance Use Topics  . Alcohol use: No    Frequency: Never  . Drug use: No    FAMILY HISTORY: Family History  Problem Relation Age of Onset  . Hypertension Mother   . Hypertension Father     ROS: Review of Systems  Constitutional: Negative for weight loss.  Respiratory: Negative for shortness of breath (at rest).   Cardiovascular: Negative for chest pain and orthopnea.  Musculoskeletal:       Positive for ankle edema    PHYSICAL EXAM: Blood pressure 112/64, pulse 79, temperature 97.9 F (36.6 C), temperature source Oral, height 5\' 2"  (1.575 m), weight 262 lb (118.8 kg), SpO2 99 %. Body mass index is 47.92 kg/m. Physical Exam  Constitutional: She appears well-developed and well-nourished.  Cardiovascular: Normal rate.  Pulmonary/Chest: Effort normal.  Musculoskeletal: Normal range of motion. She exhibits edema (bilateral lower extremities).  Skin: Skin is warm and dry.  Psychiatric: She has a normal mood and affect. Her behavior is normal.  Vitals reviewed.   RECENT LABS AND TESTS: BMET    Component Value Date/Time   NA 141 07/11/2017 0819   K 4.4 07/11/2017  0819   CL 103 07/11/2017 0819   CO2 23 07/11/2017 0819   GLUCOSE 95 07/11/2017 0819   BUN 36 (H) 07/11/2017 0819   CREATININE 1.18 (H) 07/11/2017 0819   CALCIUM 9.6 07/11/2017 0819   GFRNONAA 51 (L) 07/11/2017 0819   GFRAA 59 (L) 07/11/2017 0819   Lab Results  Component Value Date   HGBA1C 5.3 07/11/2017   HGBA1C 5.3 02/25/2017   Lab Results  Component Value Date   INSULIN 8.5 07/11/2017   INSULIN 13.3 02/25/2017   CBC    Component Value Date/Time   WBC 9.5 02/25/2017 0938   RBC 4.16 02/25/2017 0938   HGB 13.0 02/25/2017 0938   HCT 38.3 02/25/2017 0938   MCV 92 02/25/2017 0938   MCH 31.3 02/25/2017 0938    MCHC 33.9 02/25/2017 0938   RDW 13.1 02/25/2017 0938   LYMPHSABS 2.1 02/25/2017 0938   EOSABS 0.2 02/25/2017 0938   BASOSABS 0.0 02/25/2017 0938   Iron/TIBC/Ferritin/ %Sat No results found for: IRON, TIBC, FERRITIN, IRONPCTSAT Lipid Panel     Component Value Date/Time   CHOL 168 07/11/2017 0819   TRIG 100 07/11/2017 0819   HDL 47 07/11/2017 0819   LDLCALC 101 (H) 07/11/2017 0819   Hepatic Function Panel     Component Value Date/Time   PROT 6.4 07/11/2017 0819   ALBUMIN 4.1 07/11/2017 0819   AST 11 07/11/2017 0819   ALT 12 07/11/2017 0819   ALKPHOS 64 07/11/2017 0819   BILITOT 0.3 07/11/2017 0819      Component Value Date/Time   TSH 3.380 02/25/2017 0938   Results for Tracy, Beard (MRN [id number]) as of 08/20/2017 08:47  Ref. Range 07/11/2017 08:19  Vitamin D, 25-Hydroxy Latest Ref Range: 30.0 - 100.0 ng/mL 58.9   ASSESSMENT AND PLAN: Edema, lower extremity  Class 3 severe obesity with serious comorbidity and body mass index (BMI) of 45.0 to 49.9 in adult, unspecified obesity type (HCC)  PLAN:  Lower Extremity Edema Patient is to increase H2O intake and decrease sodium intake.  She is to change to a low carb plan to help with diuresis. We will follow up in 2 weeks and patient agreed to go to the ED immediately if shortness of breath or chest pain develops. Patient agrees to follow up with our clinic in 2 weeks.  We spent > than 50% of the 15 minute visit on the counseling as documented in the note.  Obesity Tracy Beard is currently in the action stage of change. As such, her goal is to continue with weight loss efforts She has agreed to change to a lower carbohydrate, vegetable and lean protein rich diet plan Tracy Beard has been instructed to work up to a goal of 150 minutes of combined cardio and strengthening exercise per week for weight loss and overall health benefits. We discussed the following Behavioral Modification Strategies today: increase H2O intake,  increasing lean protein intake, decreasing sodium intake and work on meal planning and easy cooking plans  Tracy Beard has agreed to follow up with our clinic in 2 weeks. She was informed of the importance of frequent follow up visits to maximize her success with intensive lifestyle modifications for her multiple health conditions.   OBESITY BEHAVIORAL INTERVENTION VISIT  Today's visit was # 11 out of 22.  Starting weight: 264 lbs Starting date: 02/25/17 Today's weight : 262 lbs  Today's date: 08/19/2017 Total lbs lost to date: 2    ASK: We discussed the diagnosis of obesity with Tracy Beard today  and Tracy Beard agreed to give us permission to discuss obesity behavioral modification therapy today.  ASSESS: Tracy Beard has the diagnosis of obesity and her BMI today is 47.91 Tracy Beard is in the action stage of change   ADVISE: Tracy Beard was educated on the multiple health risks of obesity as well as the benefit of weight loss to improve her health. She was advised of the need for long term treatment and the importance of lifestyle modifications.  AGREE: Multiple dietary modification options and treatment options were discussed and  Tracy Beard agreed to the above obesity treatment plan.  I, [place] CraneJoanne Murray, am acting as transcriptionist for Quillian Quincearen Beasley, MD  I have reviewed the above documentation for accuracy and completeness, and I agree with the above. -Quillian Quincearen Beasley, MD

## 2017-08-27 ENCOUNTER — Other Ambulatory Visit (INDEPENDENT_AMBULATORY_CARE_PROVIDER_SITE_OTHER): Payer: Self-pay | Admitting: Family Medicine

## 2017-08-27 DIAGNOSIS — F3289 Other specified depressive episodes: Secondary | ICD-10-CM

## 2017-09-03 ENCOUNTER — Ambulatory Visit (INDEPENDENT_AMBULATORY_CARE_PROVIDER_SITE_OTHER): Payer: BC Managed Care – PPO | Admitting: Physician Assistant

## 2017-09-03 VITALS — BP 102/71 | HR 76 | Temp 97.9°F | Ht 62.0 in | Wt 248.0 lb

## 2017-09-03 DIAGNOSIS — I10 Essential (primary) hypertension: Secondary | ICD-10-CM

## 2017-09-03 DIAGNOSIS — Z9189 Other specified personal risk factors, not elsewhere classified: Secondary | ICD-10-CM

## 2017-09-03 DIAGNOSIS — Z6841 Body Mass Index (BMI) 40.0 and over, adult: Secondary | ICD-10-CM

## 2017-09-03 DIAGNOSIS — F3289 Other specified depressive episodes: Secondary | ICD-10-CM

## 2017-09-03 MED ORDER — BUPROPION HCL ER (SR) 150 MG PO TB12: 150.0000 mg | ORAL_TABLET | Freq: Every day | ORAL | 0 refills | Status: DC

## 2017-09-05 NOTE — Progress Notes (Signed)
Office: [phone]  /  Fax: [phone]   HPI:   Chief Complaint: OBESITY Tracy Beard is here to discuss her progress with her obesity treatment plan. She is on the lower carbohydrate, vegetable and lean protein rich diet plan and is following her eating plan approximately 93 1/2 % of the time. She states she is exercising 0 minutes 0 times per week. Tracy Beard did well with weight loss on the low carb plan. She reports wanting to get back on the category 3 plan for now. Her weight is 248 lb (112.5 kg) today and has had a weight loss of 14 pounds over a period of 2 weeks since her last visit. She has lost 16 lbs since starting treatment with [place].  Hypertension Tracy Beard is a 57 y.o. female with hypertension. She is on lisinopril-HCTZ currently. Tracy Beard denies chest pain. She is working weight loss to help control her blood pressure with the goal of decreasing her risk of heart attack and stroke. Tracy Beard blood pressure is normal.  At risk for cardiovascular disease Tracy Beard is at a higher than average risk for cardiovascular disease due to obesity and hypertension. She currently denies any chest pain.  Depression with emotional eating behaviors Tracy Beard has had no recent emotional eating. Tracy Beard struggles with emotional eating and using food for comfort to the extent that it is negatively impacting her health. She often snacks when she is not hungry. Tracy Beard sometimes feels she is out of control and then feels guilty that she made poor food choices. She has been working on behavior modification techniques to help reduce her emotional eating and has been somewhat successful. She shows no sign of suicidal or homicidal ideations.  Depression screen [facility] 2/9 02/25/2017  Decreased Interest 2  Down, Depressed, Hopeless 0  PHQ - 2 Score 2  Altered sleeping 1  Tired, decreased energy 2  Change in appetite 1  Feeling bad or failure about yourself  0  Trouble concentrating  0  Moving slowly or fidgety/restless 0  PHQ-9 Score 6  Difficult doing work/chores Not difficult at all      ALLERGIES: No Known Allergies  MEDICATIONS: Current Outpatient Medications on File Prior to Visit  Medication Sig Dispense Refill  . Calcium Carbonate-Vitamin D (CALCIUM 600+D PO) Take 2 tablets by mouth daily.    . Cholecalciferol (VITAMIN D3) 2000 units TABS Take 1 capsule by mouth daily.    . diphenhydrAMINE (SOMINEX) 25 MG tablet Take 25 mg by mouth at bedtime as needed for sleep.    Marland Kitchen esomeprazole (NEXIUM) 40 MG capsule Take 40 mg by mouth daily at 12 noon.    Marland Kitchen lisinopril-hydrochlorothiazide (PRINZIDE,ZESTORETIC) 20-25 MG tablet Take 1 tablet by mouth daily.    . magnesium gluconate (MAGONATE) 500 MG tablet Take 500 mg by mouth daily.    . Multiple Vitamins-Minerals (MULTIVITAMIN ADULT PO) Take 1 tablet by mouth daily.     No current facility-administered medications on file prior to visit.     PAST MEDICAL HISTORY: Past Medical History:  Diagnosis Date  . Acid reflux   . HTN (hypertension)     PAST SURGICAL HISTORY: No past surgical history on file.  SOCIAL HISTORY: Social History   Tobacco Use  . Smoking status: Former Smoker    Packs/day: 1.00    Years: 24.00    Pack years: 24.00    Last attempt to quit: 1997    Years since quitting: 22.6  . Smokeless tobacco: Never Used  Substance Use Topics  .  Alcohol use: No    Frequency: Never  . Drug use: No    FAMILY HISTORY: Family History  Problem Relation Age of Onset  . Hypertension Mother   . Hypertension Father     ROS: Review of Systems  Constitutional: Positive for weight loss.  Cardiovascular: Negative for chest pain.  Psychiatric/Behavioral: Positive for depression. Negative for suicidal ideas.    PHYSICAL EXAM: Blood pressure 102/71, pulse 76, temperature 97.9 F (36.6 C), temperature source Oral, height 5\' 2"  (1.575 m), weight 248 lb (112.5 kg), SpO2 99 %. Body mass index is 45.36  kg/m. Physical Exam  Constitutional: She is oriented to person, place, and time. She appears well-developed and well-nourished.  Cardiovascular: Normal rate.  Pulmonary/Chest: Effort normal.  Musculoskeletal: Normal range of motion.  Neurological: She is oriented to person, place, and time.  Skin: Skin is warm and dry.  Psychiatric: She has a normal mood and affect. Her behavior is normal.  Vitals reviewed.   RECENT LABS AND TESTS: BMET    Component Value Date/Time   NA 141 07/11/2017 0819   K 4.4 07/11/2017 0819   CL 103 07/11/2017 0819   CO2 23 07/11/2017 0819   GLUCOSE 95 07/11/2017 0819   BUN 36 (H) 07/11/2017 0819   CREATININE 1.18 (H) 07/11/2017 0819   CALCIUM 9.6 07/11/2017 0819   GFRNONAA 51 (L) 07/11/2017 0819   GFRAA 59 (L) 07/11/2017 0819   Lab Results  Component Value Date   HGBA1C 5.3 07/11/2017   HGBA1C 5.3 02/25/2017   Lab Results  Component Value Date   INSULIN 8.5 07/11/2017   INSULIN 13.3 02/25/2017   CBC    Component Value Date/Time   WBC 9.5 02/25/2017 0938   RBC 4.16 02/25/2017 0938   HGB 13.0 02/25/2017 0938   HCT 38.3 02/25/2017 0938   MCV 92 02/25/2017 0938   MCH 31.3 02/25/2017 0938   MCHC 33.9 02/25/2017 0938   RDW 13.1 02/25/2017 0938   LYMPHSABS 2.1 02/25/2017 0938   EOSABS 0.2 02/25/2017 0938   BASOSABS 0.0 02/25/2017 0938   Iron/TIBC/Ferritin/ %Sat No results found for: IRON, TIBC, FERRITIN, IRONPCTSAT Lipid Panel     Component Value Date/Time   CHOL 168 07/11/2017 0819   TRIG 100 07/11/2017 0819   HDL 47 07/11/2017 0819   LDLCALC 101 (H) 07/11/2017 0819   Hepatic Function Panel     Component Value Date/Time   PROT 6.4 07/11/2017 0819   ALBUMIN 4.1 07/11/2017 0819   AST 11 07/11/2017 0819   ALT 12 07/11/2017 0819   ALKPHOS 64 07/11/2017 0819   BILITOT 0.3 07/11/2017 0819      Component Value Date/Time   TSH 3.380 02/25/2017 0938   Results for Tracy, Beard (MRN [id number]) as of 09/05/2017 10:15  Ref.  Range 07/11/2017 08:19  Vitamin D, 25-Hydroxy Latest Ref Range: 30.0 - 100.0 ng/mL 58.9   ASSESSMENT AND PLAN: Essential hypertension  Other depression - with emotional eating - Plan: buPROPion (WELLBUTRIN SR) 150 MG 12 hr tablet  At risk for heart disease  Class 3 severe obesity with serious comorbidity and body mass index (BMI) of 45.0 to 49.9 in adult, unspecified obesity type (HCC)  PLAN:  Hypertension We discussed sodium restriction, working on healthy weight loss, and a regular exercise program as the means to achieve improved blood pressure control. Tracy Beard agreed with this plan and agreed to follow up as directed. We will continue to monitor her blood pressure as well as her progress with the above  lifestyle modifications. She will continue her medications as prescribed and will watch for signs of hypotension as she continues her lifestyle modifications.  Cardiovascular risk counseling Tracy Beard was given extended (15 minutes) coronary artery disease prevention counseling today. She is 57 y.o. female and has risk factors for heart disease including obesity and hypertension. We discussed intensive lifestyle modifications today with an emphasis on specific weight loss instructions and strategies. Pt was also informed of the importance of increasing exercise and decreasing saturated fats to help prevent heart disease.  Depression with Emotional Eating Behaviors We discussed behavior modification techniques today to help Tracy Beard deal with her emotional eating and depression. She has agreed to continue Wellbutrin SR 150 mg qd #30 with no refills and follow up as directed.  Obesity Tracy Beard is currently in the action stage of change. As such, her goal is to continue with weight loss efforts She has agreed to follow the Category 3 plan Tracy Beard has been instructed to work up to a goal of 150 minutes of combined cardio and strengthening exercise per week for weight loss and overall health  benefits. We discussed the following Behavioral Modification Strategies today: work on meal planning and easy cooking plans  And planning for success  Tracy Beard has agreed to follow up with our clinic in 2 weeks. She was informed of the importance of frequent follow up visits to maximize her success with intensive lifestyle modifications for her multiple health conditions.   OBESITY BEHAVIORAL INTERVENTION VISIT  Today's visit was # 12 out of 22.  Starting weight: 264 lbs Starting date: 02/25/17 Today's weight : 248 lbs Today's date: 09/03/2017 Total lbs lost to date: 16    ASK: We discussed the diagnosis of obesity with Tracy Beard today and Tracy Beard agreed to give us permission to discuss obesity behavioral modification therapy today.  ASSESS: Tracy Beard has the diagnosis of obesity and her BMI today is 45.35 Tracy Beard is in the action stage of change   ADVISE: Tracy Beard was educated on the multiple health risks of obesity as well as the benefit of weight loss to improve her health. She was advised of the need for long term treatment and the importance of lifestyle modifications.  AGREE: Multiple dietary modification options and treatment options were discussed and  Tracy Beard agreed to the above obesity treatment plan.  Cristi LoronI, Joanne Murray, am acting as transcriptionist for Alois Clicheracey Aguilar, PA-C I, Alois Clicheracey Aguilar, PA-C have reviewed above note and agree with its content

## 2017-09-18 ENCOUNTER — Ambulatory Visit (INDEPENDENT_AMBULATORY_CARE_PROVIDER_SITE_OTHER): Payer: BC Managed Care – PPO | Admitting: Physician Assistant

## 2017-09-18 VITALS — BP 92/65 | HR 67 | Temp 97.5°F | Ht 62.0 in | Wt 246.0 lb

## 2017-09-18 DIAGNOSIS — Z6841 Body Mass Index (BMI) 40.0 and over, adult: Secondary | ICD-10-CM | POA: Diagnosis not present

## 2017-09-18 DIAGNOSIS — E559 Vitamin D deficiency, unspecified: Secondary | ICD-10-CM | POA: Diagnosis not present

## 2017-09-19 NOTE — Progress Notes (Signed)
Office: [phone]  /  Fax: [phone]   HPI:   Chief Complaint: OBESITY Tracy Beard is here to discuss her progress with her obesity treatment plan. She is on the Category 3 plan and is following her eating plan approximately 80 % of the time. She states she is at the gym doing cardio and weight lifting for 30 minutes 3 times per week. Tracy Beard did well with weight loss. She reports that she followed the plan closely and is not bored with the food on the plan.  She wants to continue with category 3. Her weight is 246 lb (111.6 kg) today and has had a weight loss of 2 pounds over a period of 2 weeks since her last visit. She has lost 18 lbs since starting treatment with [place].  Vitamin D Deficiency Tracy Beard has a diagnosis of vitamin D deficiency. She is on OTC Vit D and denies nausea, vomiting or muscle weakness.  ALLERGIES: No Known Allergies  MEDICATIONS: Current Outpatient Medications on File Prior to Visit  Medication Sig Dispense Refill  . buPROPion (WELLBUTRIN SR) 150 MG 12 hr tablet Take 1 tablet (150 mg total) by mouth daily. 30 tablet 0  . Calcium Carbonate-Vitamin D (CALCIUM 600+D PO) Take 2 tablets by mouth daily.    . Cholecalciferol (VITAMIN D3) 2000 units TABS Take 1 capsule by mouth daily.    . diphenhydrAMINE (SOMINEX) 25 MG tablet Take 25 mg by mouth at bedtime as needed for sleep.    Marland Kitchen esomeprazole (NEXIUM) 40 MG capsule Take 40 mg by mouth daily at 12 noon.    Marland Kitchen lisinopril-hydrochlorothiazide (PRINZIDE,ZESTORETIC) 20-25 MG tablet Take 1 tablet by mouth daily.    . magnesium gluconate (MAGONATE) 500 MG tablet Take 500 mg by mouth daily.    . Multiple Vitamins-Minerals (MULTIVITAMIN ADULT PO) Take 1 tablet by mouth daily.     No current facility-administered medications on file prior to visit.     PAST MEDICAL HISTORY: Past Medical History:  Diagnosis Date  . Acid reflux   . HTN (hypertension)     PAST SURGICAL HISTORY: No past surgical history on  file.  SOCIAL HISTORY: Social History   Tobacco Use  . Smoking status: Former Smoker    Packs/day: 1.00    Years: 24.00    Pack years: 24.00    Last attempt to quit: 1997    Years since quitting: 22.6  . Smokeless tobacco: Never Used  Substance Use Topics  . Alcohol use: No    Frequency: Never  . Drug use: No    FAMILY HISTORY: Family History  Problem Relation Age of Onset  . Hypertension Mother   . Hypertension Father     ROS: Review of Systems  Constitutional: Positive for weight loss.  Gastrointestinal: Negative for nausea and vomiting.  Musculoskeletal:       Negative muscle weakness    PHYSICAL EXAM: Blood pressure 92/65, pulse 67, temperature (!) 97.5 F (36.4 C), height 5\' 2"  (1.575 m), weight 246 lb (111.6 kg), SpO2 100 %. Body mass index is 44.99 kg/m. Physical Exam  Constitutional: She is oriented to person, place, and time. She appears well-developed and well-nourished.  Cardiovascular: Normal rate.  Pulmonary/Chest: Effort normal.  Musculoskeletal: Normal range of motion.  Neurological: She is oriented to person, place, and time.  Skin: Skin is warm and dry.  Psychiatric: She has a normal mood and affect. Her behavior is normal.  Vitals reviewed.   RECENT LABS AND TESTS: BMET    Component  Value Date/Time   NA 141 07/11/2017 0819   K 4.4 07/11/2017 0819   CL 103 07/11/2017 0819   CO2 23 07/11/2017 0819   GLUCOSE 95 07/11/2017 0819   BUN 36 (H) 07/11/2017 0819   CREATININE 1.18 (H) 07/11/2017 0819   CALCIUM 9.6 07/11/2017 0819   GFRNONAA 51 (L) 07/11/2017 0819   GFRAA 59 (L) 07/11/2017 0819   Lab Results  Component Value Date   HGBA1C 5.3 07/11/2017   HGBA1C 5.3 02/25/2017   Lab Results  Component Value Date   INSULIN 8.5 07/11/2017   INSULIN 13.3 02/25/2017   CBC    Component Value Date/Time   WBC 9.5 02/25/2017 0938   RBC 4.16 02/25/2017 0938   HGB 13.0 02/25/2017 0938   HCT 38.3 02/25/2017 0938   MCV 92 02/25/2017 0938    MCH 31.3 02/25/2017 0938   MCHC 33.9 02/25/2017 0938   RDW 13.1 02/25/2017 0938   LYMPHSABS 2.1 02/25/2017 0938   EOSABS 0.2 02/25/2017 0938   BASOSABS 0.0 02/25/2017 0938   Iron/TIBC/Ferritin/ %Sat No results found for: IRON, TIBC, FERRITIN, IRONPCTSAT Lipid Panel     Component Value Date/Time   CHOL 168 07/11/2017 0819   TRIG 100 07/11/2017 0819   HDL 47 07/11/2017 0819   LDLCALC 101 (H) 07/11/2017 0819   Hepatic Function Panel     Component Value Date/Time   PROT 6.4 07/11/2017 0819   ALBUMIN 4.1 07/11/2017 0819   AST 11 07/11/2017 0819   ALT 12 07/11/2017 0819   ALKPHOS 64 07/11/2017 0819   BILITOT 0.3 07/11/2017 0819      Component Value Date/Time   TSH 3.380 02/25/2017 0938  Results for DEYONA, SOZA (MRN [id number]) as of 09/19/2017 07:12  Ref. Range 07/11/2017 08:19  Vitamin D, 25-Hydroxy Latest Ref Range: 30.0 - 100.0 ng/mL 58.9    ASSESSMENT AND PLAN: Vitamin D deficiency  Class 3 severe obesity with serious comorbidity and body mass index (BMI) of 45.0 to 49.9 in adult, unspecified obesity type (HCC)  PLAN:  Vitamin D Deficiency Tracy Beard was informed that low vitamin D levels contributes to fatigue and are associated with obesity, breast, and colon cancer. Tracy Beard agrees to continue taking OTC Vit D and will follow up for routine testing of vitamin D, at least 2-3 times per year. She was informed of the risk of over-replacement of vitamin D and agrees to not increase her dose unless she discusses this with [place] first. Tracy Beard agrees to follow up with our clinic in 2 to 3 weeks.  We spent > than 50% of the 15 minute visit on the counseling as documented in the note.  Obesity Tracy Beard is currently in the action stage of change. As such, her goal is to continue with weight loss efforts She has agreed to follow the Category 3 plan Tracy Beard has been instructed to work up to a goal of 150 minutes of combined cardio and strengthening exercise per week for  weight loss and overall health benefits. We discussed the following Behavioral Modification Strategies today: work on meal planning and easy cooking plans and ways to avoid boredom eating   Tracy Beard has agreed to follow up with our clinic in 2 to 3 weeks. She was informed of the importance of frequent follow up visits to maximize her success with intensive lifestyle modifications for her multiple health conditions.   OBESITY BEHAVIORAL INTERVENTION VISIT  Today's visit was # 13.  Starting weight: 264 lbs Starting date: 02/25/17 Today's weight : 246 lbs  Today's date: 09/18/2017 Total lbs lost to date: 6118    ASK: We discussed the diagnosis of obesity with [name] Masley today and Tracy JohnsKathleen agreed to give us permission to discuss obesity behavioral modification therapy today.  ASSESS: Tracy JohnsKathleen has the diagnosis of obesity and her BMI today is 44.98 Tracy JohnsKathleen is in the action stage of change   ADVISE: Tracy JohnsKathleen was educated on the multiple health risks of obesity as well as the benefit of weight loss to improve her health. She was advised of the need for long term treatment and the importance of lifestyle modifications.  AGREE: Multiple dietary modification options and treatment options were discussed and  Tracy JohnsKathleen agreed to the above obesity treatment plan.  Trude McburneyI, Sharon Martin, am acting as transcriptionist for Alois Clicheracey Aguilar, PA-C I, Alois Clicheracey Aguilar, PA-C have reviewed above note and agree with its content

## 2017-10-07 ENCOUNTER — Encounter (INDEPENDENT_AMBULATORY_CARE_PROVIDER_SITE_OTHER): Payer: Self-pay | Admitting: Bariatrics

## 2017-10-07 ENCOUNTER — Ambulatory Visit (INDEPENDENT_AMBULATORY_CARE_PROVIDER_SITE_OTHER): Payer: BC Managed Care – PPO | Admitting: Bariatrics

## 2017-10-07 VITALS — BP 89/62 | HR 64 | Temp 97.7°F | Ht 62.0 in | Wt 244.0 lb

## 2017-10-07 DIAGNOSIS — F3289 Other specified depressive episodes: Secondary | ICD-10-CM

## 2017-10-07 DIAGNOSIS — I10 Essential (primary) hypertension: Secondary | ICD-10-CM

## 2017-10-07 DIAGNOSIS — Z6841 Body Mass Index (BMI) 40.0 and over, adult: Secondary | ICD-10-CM

## 2017-10-07 DIAGNOSIS — Z9189 Other specified personal risk factors, not elsewhere classified: Secondary | ICD-10-CM | POA: Diagnosis not present

## 2017-10-07 MED ORDER — BUPROPION HCL ER (SR) 150 MG PO TB12: 150.0000 mg | ORAL_TABLET | Freq: Every day | ORAL | 0 refills | Status: DC

## 2017-10-07 NOTE — Progress Notes (Signed)
Office: [phone]  /  Fax: [phone]   HPI:   Chief Complaint: OBESITY Tracy Beard is here to discuss her progress with her obesity treatment plan. She is on the Category 3 plan and is following her eating plan approximately 80 % of the time. She states she is doing cardio and weights for 20-40 minutes 3 times per week. Tracy Beard states Category 3 is "still working" and she notes it is simple and easy. She denies hunger or cravings.  Her weight is 244 lb (110.7 kg) today and has had a weight loss of 2 pounds over a period of 2 to 3 weeks since her last visit. She has lost 20 lbs since starting treatment with [place].  Hypertension Tracy Beard is a 57 y.o. female with hypertension. Tracy Beard's blood pressure is low at 89/62. She denies weakness or lightheadedness. She states her blood pressure is "only low here". She is working weight loss to help control her blood pressure with the goal of decreasing her risk of heart attack and stroke. Tracy Beard's blood pressure is not currently controlled.  At risk for cardiovascular disease Tracy Beard is at a higher than average risk for cardiovascular disease due to obesity and hypertension. She currently denies any chest pain.  Depression with emotional eating behaviors Tracy Beard denies cravings with the Wellbutrin. Tracy Beard struggles with emotional eating and using food for comfort to the extent that it is negatively impacting her health. She often snacks when she is not hungry. Tracy Beard sometimes feels she is out of control and then feels guilty that she made poor food choices. She has been working on behavior modification techniques to help reduce her emotional eating and has been somewhat successful. She shows no sign of suicidal or homicidal ideations.  Depression screen [facility] 2/9 02/25/2017  Decreased Interest 2  Down, Depressed, Hopeless 0  PHQ - 2 Score 2  Altered sleeping 1  Tired, decreased energy 2  Change in appetite 1  Feeling bad or  failure about yourself  0  Trouble concentrating 0  Moving slowly or fidgety/restless 0  PHQ-9 Score 6  Difficult doing work/chores Not difficult at all    ALLERGIES: No Known Allergies  MEDICATIONS: Current Outpatient Medications on File Prior to Visit  Medication Sig Dispense Refill  . buPROPion (WELLBUTRIN SR) 150 MG 12 hr tablet Take 1 tablet (150 mg total) by mouth daily. 30 tablet 0  . Calcium Carbonate-Vitamin D (CALCIUM 600+D PO) Take 2 tablets by mouth daily.    . Cholecalciferol (VITAMIN D3) 2000 units TABS Take 1 capsule by mouth daily.    . diphenhydrAMINE (SOMINEX) 25 MG tablet Take 25 mg by mouth at bedtime as needed for sleep.    Marland Kitchen esomeprazole (NEXIUM) 40 MG capsule Take 40 mg by mouth daily at 12 noon.    Marland Kitchen lisinopril-hydrochlorothiazide (PRINZIDE,ZESTORETIC) 20-25 MG tablet Take 1 tablet by mouth daily.    . magnesium gluconate (MAGONATE) 500 MG tablet Take 500 mg by mouth daily.    . Multiple Vitamins-Minerals (MULTIVITAMIN ADULT PO) Take 1 tablet by mouth daily.     No current facility-administered medications on file prior to visit.     PAST MEDICAL HISTORY: Past Medical History:  Diagnosis Date  . Acid reflux   . HTN (hypertension)     PAST SURGICAL HISTORY: No past surgical history on file.  SOCIAL HISTORY: Social History   Tobacco Use  . Smoking status: Former Smoker    Packs/day: 1.00    Years: 24.00    Pack  years: 24.00    Last attempt to quit: 1997    Years since quitting: 22.7  . Smokeless tobacco: Never Used  Substance Use Topics  . Alcohol use: No    Frequency: Never  . Drug use: No    FAMILY HISTORY: Family History  Problem Relation Age of Onset  . Hypertension Mother   . Hypertension Father     ROS: Review of Systems  Constitutional: Positive for weight loss.  Cardiovascular: Negative for chest pain.  Neurological: Negative for weakness.       Negative lightheadedness  Psychiatric/Behavioral: Positive for depression.  Negative for suicidal ideas.    PHYSICAL EXAM: Blood pressure (!) 89/62, pulse 64, temperature 97.7 F (36.5 C), temperature source Oral, height 5\' 2"  (1.575 m), weight 244 lb (110.7 kg), SpO2 98 %. Body mass index is 44.63 kg/m. Physical Exam  Constitutional: She is oriented to person, place, and time. She appears well-developed and well-nourished.  Cardiovascular: Normal rate.  Pulmonary/Chest: Effort normal.  Musculoskeletal: Normal range of motion.  Neurological: She is oriented to person, place, and time.  Skin: Skin is warm and dry.  Psychiatric: She has a normal mood and affect. Her behavior is normal.  Vitals reviewed.   RECENT LABS AND TESTS: BMET    Component Value Date/Time   NA 141 07/11/2017 0819   K 4.4 07/11/2017 0819   CL 103 07/11/2017 0819   CO2 23 07/11/2017 0819   GLUCOSE 95 07/11/2017 0819   BUN 36 (H) 07/11/2017 0819   CREATININE 1.18 (H) 07/11/2017 0819   CALCIUM 9.6 07/11/2017 0819   GFRNONAA 51 (L) 07/11/2017 0819   GFRAA 59 (L) 07/11/2017 0819   Lab Results  Component Value Date   HGBA1C 5.3 07/11/2017   HGBA1C 5.3 02/25/2017   Lab Results  Component Value Date   INSULIN 8.5 07/11/2017   INSULIN 13.3 02/25/2017   CBC    Component Value Date/Time   WBC 9.5 02/25/2017 0938   RBC 4.16 02/25/2017 0938   HGB 13.0 02/25/2017 0938   HCT 38.3 02/25/2017 0938   MCV 92 02/25/2017 0938   MCH 31.3 02/25/2017 0938   MCHC 33.9 02/25/2017 0938   RDW 13.1 02/25/2017 0938   LYMPHSABS 2.1 02/25/2017 0938   EOSABS 0.2 02/25/2017 0938   BASOSABS 0.0 02/25/2017 0938   Iron/TIBC/Ferritin/ %Sat No results found for: IRON, TIBC, FERRITIN, IRONPCTSAT Lipid Panel     Component Value Date/Time   CHOL 168 07/11/2017 0819   TRIG 100 07/11/2017 0819   HDL 47 07/11/2017 0819   LDLCALC 101 (H) 07/11/2017 0819   Hepatic Function Panel     Component Value Date/Time   PROT 6.4 07/11/2017 0819   ALBUMIN 4.1 07/11/2017 0819   AST 11 07/11/2017 0819    ALT 12 07/11/2017 0819   ALKPHOS 64 07/11/2017 0819   BILITOT 0.3 07/11/2017 0819      Component Value Date/Time   TSH 3.380 02/25/2017 0938    ASSESSMENT AND PLAN: Essential hypertension  Other depression - with emotional eating - Plan: buPROPion (WELLBUTRIN SR) 150 MG 12 hr tablet  At risk for heart disease  Class 3 severe obesity with serious comorbidity and body mass index (BMI) of 40.0 to 44.9 in adult, unspecified obesity type (HCC)  PLAN:  Hypertension We discussed sodium restriction, working on healthy weight loss, and a regular exercise program as the means to achieve improved blood pressure control. Tracy Beard agreed with this plan and agreed to follow up as directed. We  will continue to monitor her blood pressure as well as her progress with the above lifestyle modifications. She will continue her medications at this time and will watch for signs of hypotension as she continues her lifestyle modifications. Tracy Beard was advised if she becomes symptomatic to call her primary care physician or our office to lower medications. Tracy Beard agrees to follow up with our clinic in 2 weeks with myself or Dr. Dalbert Garnet.  Cardiovascular risk counselling Tracy Beard was given extended (15 minutes) coronary artery disease prevention counseling today. She is 57 y.o. female and has risk factors for heart disease including obesity and hypertension. We discussed intensive lifestyle modifications today with an emphasis on specific weight loss instructions and strategies. Pt was also informed of the importance of increasing exercise and decreasing saturated fats to help prevent heart disease.  Depression with Emotional Eating Behaviors We discussed behavior modification techniques today to help Tracy Beard deal with her emotional eating and depression. Tracy Beard agrees to continue taking Wellbutrin SR 150 mg qd #30 and we will refill for 1 month. Tracy Beard agrees to follow up with our clinic in 2 weeks with  myself or Dr. Dalbert Garnet.  Obesity Tracy Beard is currently in the action stage of change. As such, her goal is to continue with weight loss efforts She has agreed to follow the Category 3 plan Tracy Beard has been instructed to work up to a goal of 150 minutes of combined cardio and strengthening exercise per week for weight loss and overall health benefits. We discussed the following Behavioral Modification Strategies today: increasing lean protein intake, decreasing simple carbohydrates, increasing vegetables, increase H20 intake, and no skipping meals Tracy Beard will continue Category 3 plan, and she will try to keep her schedule simple and do meal planning.  Tracy Beard has agreed to follow up with our clinic in 2 weeks with myself or Dr. Dalbert Garnet. She was informed of the importance of frequent follow up visits to maximize her success with intensive lifestyle modifications for her multiple health conditions.   OBESITY BEHAVIORAL INTERVENTION VISIT  Today's visit was # 14   Starting weight: 264 lbs Starting date: 02/25/17 Today's weight : 244 lbs Today's date: 10/07/2017 Total lbs lost to date: 20 We  discussed the following behavioral interventions this visit.   ASK: We discussed the diagnosis of obesity with [name] today and Tracy Beard agreed to give [place] permission to discuss obesity behavioral modification therapy today.  ASSESS: Tracy Beard has the diagnosis of obesity and her BMI today is 44.62 Tracy Beard is in the action stage of change   ADVISE: Tracy Beard was educated on the multiple health risks of obesity as well as the benefit of weight loss to improve her health. She was advised of the need for long term treatment and the importance of lifestyle modifications to improve her current health and to decrease her risk of future health problems.  AGREE: Multiple dietary modification options and treatment options were discussed and  Tracy Beard agreed to follow the recommendations  documented in the above note.  ARRANGE: Tracy Beard was educated on the importance of frequent visits to treat obesity as outlined per CMS and USPSTF guidelines and agreed to schedule her next follow up appointment today.  Trude Mcburney, am acting as transcriptionist for [employer], DO  I have reviewed the above documentation for accuracy and completeness, and I agree with the above. -Corinna Capra, DO

## 2017-10-21 ENCOUNTER — Ambulatory Visit (INDEPENDENT_AMBULATORY_CARE_PROVIDER_SITE_OTHER): Payer: BC Managed Care – PPO | Admitting: Bariatrics

## 2017-10-21 VITALS — BP 125/78 | HR 72 | Temp 97.4°F | Ht 62.0 in | Wt 244.0 lb

## 2017-10-21 DIAGNOSIS — Z6841 Body Mass Index (BMI) 40.0 and over, adult: Secondary | ICD-10-CM

## 2017-10-21 DIAGNOSIS — Z9189 Other specified personal risk factors, not elsewhere classified: Secondary | ICD-10-CM

## 2017-10-21 DIAGNOSIS — I10 Essential (primary) hypertension: Secondary | ICD-10-CM | POA: Diagnosis not present

## 2017-10-21 DIAGNOSIS — F3289 Other specified depressive episodes: Secondary | ICD-10-CM

## 2017-10-21 NOTE — Progress Notes (Signed)
Office: [phone]  /  Fax: [phone]   HPI:   Chief Complaint: OBESITY Tracy JohnsKathleen is here to discuss her progress with her obesity treatment plan. She is on the  follow the Category 3 plan and is following her eating plan approximately 80-85 % of the time. She states she is exercising by weight lifting for 60 minutes 4-5 times per week. Tracy JohnsKathleen is lifting more weights and decreased her cardio. She has not been eating all of her food. Her vegetable intake is minimal.  Her weight is 244 lb (110.7 kg) today and she maintained her weight since her last visit. She has lost 20 lbs since starting treatment with us.  Hypertension Tracy CouncilmanKathleen Beard is a 57 y.o. female with hypertension.  Tracy CouncilmanKathleen Argueta denies chest pain, lightheadedness, hypotension or shortness of breath on exertion. She is working weight loss to help control her blood pressure with the goal of decreasing her risk of heart attack and stroke. Tracy Beard blood pressure today is 125/78. She is currently taking lisinopril-hctz with no side effects.   Depression with emotional eating behaviors Tracy JohnsKathleen is struggling with emotional eating and using food for comfort to the extent that it is negatively impacting her health. She often snacks when she is not hungry. Tracy JohnsKathleen sometimes feels she is out of control and then feels guilty that she made poor food choices. She has been working on behavior modification techniques to help reduce her emotional eating and has been somewhat successful. Wellbutrin is helping with cravings. She shows no sign of suicidal or homicidal ideations.  Depression screen [facility] 2/9 02/25/2017  Decreased Interest 2  Down, Depressed, Hopeless 0  PHQ - 2 Score 2  Altered sleeping 1  Tired, decreased energy 2  Change in appetite 1  Feeling bad or failure about yourself  0  Trouble concentrating 0  Moving slowly or fidgety/restless 0  PHQ-9 Score 6  Difficult doing work/chores Not difficult at all   At  risk for cardiovascular disease Tracy JohnsKathleen is at a higher than average risk for cardiovascular disease due to obesity. She currently denies any chest pain.  ALLERGIES: No Known Allergies  MEDICATIONS: Current Outpatient Medications on File Prior to Visit  Medication Sig Dispense Refill  . buPROPion (WELLBUTRIN SR) 150 MG 12 hr tablet Take 1 tablet (150 mg total) by mouth daily. 30 tablet 0  . Calcium Carbonate-Vitamin D (CALCIUM 600+D PO) Take 2 tablets by mouth daily.    . Cholecalciferol (VITAMIN D3) 2000 units TABS Take 1 capsule by mouth daily.    . diphenhydrAMINE (SOMINEX) 25 MG tablet Take 25 mg by mouth at bedtime as needed for sleep.    Marland Kitchen. esomeprazole (NEXIUM) 40 MG capsule Take 40 mg by mouth daily at 12 noon.    Marland Kitchen. lisinopril-hydrochlorothiazide (PRINZIDE,ZESTORETIC) 20-25 MG tablet Take 1 tablet by mouth daily.    . magnesium gluconate (MAGONATE) 500 MG tablet Take 500 mg by mouth daily.    . Multiple Vitamins-Minerals (MULTIVITAMIN ADULT PO) Take 1 tablet by mouth daily.     No current facility-administered medications on file prior to visit.     PAST MEDICAL HISTORY: Past Medical History:  Diagnosis Date  . Acid reflux   . HTN (hypertension)     PAST SURGICAL HISTORY: No past surgical history on file.  SOCIAL HISTORY: Social History   Tobacco Use  . Smoking status: Former Smoker    Packs/day: 1.00    Years: 24.00    Pack years: 24.00    Last attempt  to quit: 1997    Years since quitting: 22.7  . Smokeless tobacco: Never Used  Substance Use Topics  . Alcohol use: No    Frequency: Never  . Drug use: No    FAMILY HISTORY: Family History  Problem Relation Age of Onset  . Hypertension Mother   . Hypertension Father     ROS: Review of Systems  Constitutional: Negative for weight loss.  Respiratory: Negative for shortness of breath.   Cardiovascular: Negative for chest pain.       Negative for hypotension  Neurological:       Negative for  lightheadedness  Psychiatric/Behavioral: Positive for depression. Negative for suicidal ideas.       Negative for homicidal ideations     PHYSICAL EXAM: Blood pressure 125/78, pulse 72, temperature (!) 97.4 F (36.3 C), temperature source Oral, height 5\' 2"  (1.575 m), weight 244 lb (110.7 kg), SpO2 98 %. Body mass index is 44.63 kg/m. Physical Exam  Constitutional: She is oriented to person, place, and time. She appears well-developed and well-nourished.  HENT:  Head: Normocephalic.  Cardiovascular: Normal rate.  Pulmonary/Chest: Effort normal.  Musculoskeletal: Normal range of motion.  Neurological: She is oriented to person, place, and time.  Skin: Skin is warm and dry.  Psychiatric: She has a normal mood and affect. Her behavior is normal.  Vitals reviewed.   RECENT LABS AND TESTS: BMET    Component Value Date/Time   NA 141 07/11/2017 0819   K 4.4 07/11/2017 0819   CL 103 07/11/2017 0819   CO2 23 07/11/2017 0819   GLUCOSE 95 07/11/2017 0819   BUN 36 (H) 07/11/2017 0819   CREATININE 1.18 (H) 07/11/2017 0819   CALCIUM 9.6 07/11/2017 0819   GFRNONAA 51 (L) 07/11/2017 0819   GFRAA 59 (L) 07/11/2017 0819   Lab Results  Component Value Date   HGBA1C 5.3 07/11/2017   HGBA1C 5.3 02/25/2017   Lab Results  Component Value Date   INSULIN 8.5 07/11/2017   INSULIN 13.3 02/25/2017   CBC    Component Value Date/Time   WBC 9.5 02/25/2017 0938   RBC 4.16 02/25/2017 0938   HGB 13.0 02/25/2017 0938   HCT 38.3 02/25/2017 0938   MCV 92 02/25/2017 0938   MCH 31.3 02/25/2017 0938   MCHC 33.9 02/25/2017 0938   RDW 13.1 02/25/2017 0938   LYMPHSABS 2.1 02/25/2017 0938   EOSABS 0.2 02/25/2017 0938   BASOSABS 0.0 02/25/2017 0938   Iron/TIBC/Ferritin/ %Sat No results found for: IRON, TIBC, FERRITIN, IRONPCTSAT Lipid Panel     Component Value Date/Time   CHOL 168 07/11/2017 0819   TRIG 100 07/11/2017 0819   HDL 47 07/11/2017 0819   LDLCALC 101 (H) 07/11/2017 0819    Hepatic Function Panel     Component Value Date/Time   PROT 6.4 07/11/2017 0819   ALBUMIN 4.1 07/11/2017 0819   AST 11 07/11/2017 0819   ALT 12 07/11/2017 0819   ALKPHOS 64 07/11/2017 0819   BILITOT 0.3 07/11/2017 0819      Component Value Date/Time   TSH 3.380 02/25/2017 0938    ASSESSMENT AND PLAN: Essential hypertension - Plan: Comprehensive metabolic panel, Hemoglobin A1c, Insulin, random, VITAMIN D 25 Hydroxy (Vit-D Deficiency, Fractures)  Other depression - with emtoional eating  At risk for heart disease  Class 3 severe obesity with serious comorbidity and body mass index (BMI) of 40.0 to 44.9 in adult, unspecified obesity type (HCC)  PLAN: Hypertension We discussed sodium restriction, working on healthy  weight loss, and a regular exercise program as the means to achieve improved blood pressure control. Tracy Beard agreed with this plan and agreed to follow up as directed. We will continue to monitor her blood pressure as well as her progress with the above lifestyle modifications. She will continue her medications as prescribed, increase water intake, and avoid sodium rich foods, and will watch for signs of hypotension as she continues her lifestyle modifications.   Depression with Emotional Eating Behaviors We discussed behavior modification techniques today to help Tracy Beard deal with her emotional eating and depression. She has agreed to continue Wellbutrin SR 150 mg qd and agreed to follow up as directed. Discussed cognitive behavior therapy strategies for emotional eating.   Cardiovascular risk counseling Tracy Beard was given extended (15 minutes) coronary artery disease prevention counseling today. She is 57 y.o. female and has risk factors for heart disease including obesity. We discussed intensive lifestyle modifications today with an emphasis on specific weight loss instructions and strategies. Pt was also informed of the importance of increasing exercise and decreasing  saturated fats to help prevent heart disease.  Obesity Tracy Beard is currently in the action stage of change. As such, her goal is to continue with weight loss efforts She has agreed to follow the Category 3 plan. She was give "Halloween Treat" sheet and agrees to eat all food on her plan and increase protein and vegetables.  Tracy Beard has been instructed to work up to a goal of 150 minutes of combined cardio and strengthening exercise per week or increase cardio and resistance training to 4-5 times a week for 1-1.5 hours.  for weight loss and overall health benefits. We discussed the following Behavioral Modification Strategies today: increasing lean protein intake, decreasing simple carbohydrates, increase water intake, no skipping meals,  decrease eating out   Tracy Beard has agreed to follow up with our clinic in 2 weeks. She was informed of the importance of frequent follow up visits to maximize her success with intensive lifestyle modifications for her multiple health conditions.   OBESITY BEHAVIORAL INTERVENTION VISIT  Today's visit was # 15   Starting weight: 264 lb Starting date: 02/25/17 Today's weight : 244 lb Today's date: 10/21/2017 Total lbs lost to date: 20 lb    ASK: We discussed the diagnosis of obesity with Tracy Beard today and Tracy Beard agreed to give [place] permission to discuss obesity behavioral modification therapy today.  ASSESS: Tracy Beard has the diagnosis of obesity and her BMI today is 44.62 Tracy Beard is in the action stage of change   ADVISE: Tracy Beard was educated on the multiple health risks of obesity as well as the benefit of weight loss to improve her health. She was advised of the need for long term treatment and the importance of lifestyle modifications to improve her current health and to decrease her risk of future health problems.  AGREE: Multiple dietary modification options and treatment options were discussed and  Tracy Beard agreed to follow the  recommendations documented in the above note.  ARRANGE: Tracy Beard was educated on the importance of frequent visits to treat obesity as outlined per CMS and USPSTF guidelines and agreed to schedule her next follow up appointment today.  Otis Peak, am acting as transcriptionist for [employer], DO  I have reviewed the above documentation for accuracy and completeness, and I agree with the above. -Corinna Capra, DO

## 2017-10-22 LAB — COMPREHENSIVE METABOLIC PANEL
ALT: 19 IU/L (ref 0–32)
AST: 12 IU/L (ref 0–40)
Albumin/Globulin Ratio: 2.2 (ref 1.2–2.2)
Albumin: 4.4 g/dL (ref 3.5–5.5)
Alkaline Phosphatase: 57 IU/L (ref 39–117)
BUN/Creatinine Ratio: 38 — ABNORMAL HIGH (ref 9–23)
BUN: 51 mg/dL — ABNORMAL HIGH (ref 6–24)
Bilirubin Total: <0.2 mg/dL (ref 0.0–1.2)
CO2: 20 mmol/L (ref 20–29)
Calcium: 9.6 mg/dL (ref 8.7–10.2)
Chloride: 102 mmol/L (ref 96–106)
Creatinine, Ser: 1.33 mg/dL — ABNORMAL HIGH (ref 0.57–1.00)
GFR calc Af Amer: 51 mL/min/{1.73_m2} — ABNORMAL LOW (ref >59)
GFR calc non Af Amer: 44 mL/min/{1.73_m2} — ABNORMAL LOW (ref >59)
Globulin, Total: 2 g/dL (ref 1.5–4.5)
Glucose: 97 mg/dL (ref 65–99)
Potassium: 4.6 mmol/L (ref 3.5–5.2)
Sodium: 137 mmol/L (ref 134–144)
Total Protein: 6.4 g/dL (ref 6.0–8.5)

## 2017-10-22 LAB — VITAMIN D 25 HYDROXY (VIT D DEFICIENCY, FRACTURES): Vit D, 25-Hydroxy: 59.6 ng/mL (ref 30.0–100.0)

## 2017-10-22 LAB — HEMOGLOBIN A1C
Est. average glucose Bld gHb Est-mCnc: 105 mg/dL
Hgb A1c MFr Bld: 5.3 % (ref 4.8–5.6)

## 2017-10-22 LAB — INSULIN, RANDOM: INSULIN: 9.5 u[IU]/mL (ref 2.6–24.9)

## 2017-11-04 ENCOUNTER — Ambulatory Visit (INDEPENDENT_AMBULATORY_CARE_PROVIDER_SITE_OTHER): Payer: BC Managed Care – PPO | Admitting: Bariatrics

## 2017-11-04 VITALS — BP 93/63 | HR 75 | Temp 97.7°F | Ht 62.0 in | Wt 247.0 lb

## 2017-11-04 DIAGNOSIS — Z6841 Body Mass Index (BMI) 40.0 and over, adult: Secondary | ICD-10-CM

## 2017-11-04 DIAGNOSIS — F3289 Other specified depressive episodes: Secondary | ICD-10-CM

## 2017-11-04 DIAGNOSIS — I10 Essential (primary) hypertension: Secondary | ICD-10-CM | POA: Diagnosis not present

## 2017-11-04 NOTE — Progress Notes (Signed)
Office: [phone]  /  Fax: [phone]   HPI:   Chief Complaint: OBESITY Tracy Beard is here to discuss her progress with her obesity treatment plan. She is on the Category 3 plan and is following her eating plan approximately 50 % of the time. She states she is weight lifting 60 to 90 minutes 3 to 5 times per week. Tracy Beard is doing ok with the Category 3 plan. She was not as vigilant at monitoring her foods on late nights and early mornings. Her hunger and cravings are controlled.  Her weight is 247 lb (112 kg) today and has not lost weight since her last visit. She has lost 17 lbs since starting treatment with [place].  Hypertension Tracy Beard is a 57 y.o. female with hypertension.  She is working on weight loss to help control her blood pressure with the goal of decreasing her risk of heart attack and stroke. Tracy Beard's blood pressure is currently well controlled. It is slightly decreased today at 93/63. She denies lightheadedness.She has decreased blood pressure medicines before and her blood pressure was elevated.  Depression with emotional eating behaviors Tracy Beard is struggling with emotional eating and using food for comfort to the extent that it is negatively impacting her health. She is taking Wellbutrin SR 150mg . She denies hypotension or lightheadedness.She shows no sign of suicidal or homicidal ideations.  ALLERGIES: No Known Allergies  MEDICATIONS: Current Outpatient Medications on File Prior to Visit  Medication Sig Dispense Refill  . buPROPion (WELLBUTRIN SR) 150 MG 12 hr tablet Take 1 tablet (150 mg total) by mouth daily. 30 tablet 0  . Calcium Carbonate-Vitamin D (CALCIUM 600+D PO) Take 2 tablets by mouth daily.    . Cholecalciferol (VITAMIN D3) 2000 units TABS Take 1 capsule by mouth daily.    . diphenhydrAMINE (SOMINEX) 25 MG tablet Take 25 mg by mouth at bedtime as needed for sleep.    Marland Kitchen esomeprazole (NEXIUM) 40 MG capsule Take 40 mg by mouth daily at 12  noon.    Marland Kitchen lisinopril-hydrochlorothiazide (PRINZIDE,ZESTORETIC) 20-25 MG tablet Take 1 tablet by mouth daily.    . magnesium gluconate (MAGONATE) 500 MG tablet Take 500 mg by mouth daily.    . Multiple Vitamins-Minerals (MULTIVITAMIN ADULT PO) Take 1 tablet by mouth daily.     No current facility-administered medications on file prior to visit.     PAST MEDICAL HISTORY: Past Medical History:  Diagnosis Date  . Acid reflux   . HTN (hypertension)     PAST SURGICAL HISTORY: No past surgical history on file.  SOCIAL HISTORY: Social History   Tobacco Use  . Smoking status: Former Smoker    Packs/day: 1.00    Years: 24.00    Pack years: 24.00    Last attempt to quit: 1997    Years since quitting: 22.7  . Smokeless tobacco: Never Used  Substance Use Topics  . Alcohol use: No    Frequency: Never  . Drug use: No    FAMILY HISTORY: Family History  Problem Relation Age of Onset  . Hypertension Mother   . Hypertension Father     ROS: Review of Systems  Constitutional: Negative for weight loss.  Cardiovascular:       Negative for hypotension.  Neurological:       Negative for lightheadedness.  Psychiatric/Behavioral: Positive for depression. Negative for suicidal ideas.       Negative for homicidal ideations.    PHYSICAL EXAM: Blood pressure 93/63, pulse 75, temperature 97.7 F (36.5 C),  temperature source Oral, height 5\' 2"  (1.575 m), weight 247 lb (112 kg), SpO2 97 %. Body mass index is 45.18 kg/m. Physical Exam  Constitutional: She is oriented to person, place, and time. She appears well-developed and well-nourished.  Cardiovascular: Normal rate.  Pulmonary/Chest: Effort normal.  Musculoskeletal: Normal range of motion.  Neurological: She is alert and oriented to person, place, and time.  Skin: Skin is warm and dry.  Psychiatric: She has a normal mood and affect. Her behavior is normal.  Vitals reviewed.   RECENT LABS AND TESTS: BMET    Component Value  Date/Time   NA 137 10/21/2017 0902   K 4.6 10/21/2017 0902   CL 102 10/21/2017 0902   CO2 20 10/21/2017 0902   GLUCOSE 97 10/21/2017 0902   BUN 51 (H) 10/21/2017 0902   CREATININE 1.33 (H) 10/21/2017 0902   CALCIUM 9.6 10/21/2017 0902   GFRNONAA 44 (L) 10/21/2017 0902   GFRAA 51 (L) 10/21/2017 0902   Lab Results  Component Value Date   HGBA1C 5.3 10/21/2017   HGBA1C 5.3 07/11/2017   HGBA1C 5.3 02/25/2017   Lab Results  Component Value Date   INSULIN 9.5 10/21/2017   INSULIN 8.5 07/11/2017   INSULIN 13.3 02/25/2017   CBC    Component Value Date/Time   WBC 9.5 02/25/2017 0938   RBC 4.16 02/25/2017 0938   HGB 13.0 02/25/2017 0938   HCT 38.3 02/25/2017 0938   MCV 92 02/25/2017 0938   MCH 31.3 02/25/2017 0938   MCHC 33.9 02/25/2017 0938   RDW 13.1 02/25/2017 0938   LYMPHSABS 2.1 02/25/2017 0938   EOSABS 0.2 02/25/2017 0938   BASOSABS 0.0 02/25/2017 0938   Iron/TIBC/Ferritin/ %Sat No results found for: IRON, TIBC, FERRITIN, IRONPCTSAT Lipid Panel     Component Value Date/Time   CHOL 168 07/11/2017 0819   TRIG 100 07/11/2017 0819   HDL 47 07/11/2017 0819   LDLCALC 101 (H) 07/11/2017 0819   Hepatic Function Panel     Component Value Date/Time   PROT 6.4 10/21/2017 0902   ALBUMIN 4.4 10/21/2017 0902   AST 12 10/21/2017 0902   ALT 19 10/21/2017 0902   ALKPHOS 57 10/21/2017 0902   BILITOT <0.2 10/21/2017 0902      Component Value Date/Time   TSH 3.380 02/25/2017 0938   Results for CARMYN, HAMM (MRN [id number]) as of 11/04/2017 11:09  Ref. Range 10/21/2017 09:02  Vitamin D, 25-Hydroxy Latest Ref Range: 30.0 - 100.0 ng/mL 59.6   ASSESSMENT AND PLAN: Essential hypertension  Other depression - with emotional eating   Class 3 severe obesity with serious comorbidity and body mass index (BMI) of 45.0 to 49.9 in adult, unspecified obesity type (HCC)  PLAN:  Hypertension We discussed sodium restriction, working on healthy weight loss, and a regular  exercise program as the means to achieve improved blood pressure control. Tracy Beard agreed with this plan and agreed to follow up as directed. We will continue to monitor her blood pressure as well as her progress with the above lifestyle modifications. She will continue her lisinopril/HCTZ 20-25mg  and will watch for signs of hypotension as she continues her lifestyle modifications. Tracy Beard agrees to follow up in 2 weeks.  Depression with Emotional Eating Behaviors We discussed behavior modification techniques today to help Tracy Beard deal with her emotional eating and depression. She has agreed to continue to take Wellbutrin SR 150 mg qd and agreed to follow up as directed in 2 weeks.  Obesity Tracy Beard is currently in the action stage  of change. As such, her goal is to continue with weight loss efforts. She has agreed to follow the Category 3 plan. She agrees to increase water intake and decrease sodium. Tracy Beard has been instructed to work up to a goal of 150 minutes of combined cardio and strengthening exercise per week for weight loss and overall health benefits. We discussed the following Behavioral Modification Strategies today: increasing lean protein intake, decreasing simple carbohydrates, increase H2O intake, and decreasing sodium intake.  I spent > than 50% of the 15 minute visit on counseling as documented in the note.  Tracy Beard has agreed to follow up with our clinic in 2 weeks. She was informed of the importance of frequent follow up visits to maximize her success with intensive lifestyle modifications for her multiple health conditions.   OBESITY BEHAVIORAL INTERVENTION VISIT  Today's visit was # 16   Starting weight: 264 lbs Starting date: 02/25/17 Today's weight : Weight: 247 lb (112 kg)  Today's date: 11/04/2017 Total lbs lost to date: 17  ASK: We discussed the diagnosis of obesity with Tracy Beard today and Tracy Beard agreed to give [place] permission to discuss obesity  behavioral modification therapy today.  ASSESS: Tracy Beard has the diagnosis of obesity and her BMI today is 45.17. Tracy Beard is in the action stage of change.   ADVISE: Tracy Beard was educated on the multiple health risks of obesity as well as the benefit of weight loss to improve her health. She was advised of the need for long term treatment and the importance of lifestyle modifications to improve her current health and to decrease her risk of future health problems.  AGREE: Multiple dietary modification options and treatment options were discussed and Tracy Beard agreed to follow the recommendations documented in the above note.  ARRANGE: Tracy Beard was educated on the importance of frequent visits to treat obesity as outlined per CMS and USPSTF guidelines and agreed to schedule her next follow up appointment today.  I, Kirke Corin, am acting as Energy manager for [employer]. Manson Passey, DO  I have reviewed the above documentation for accuracy and completeness, and I agree with the above. -Corinna Capra, DO

## 2017-11-18 ENCOUNTER — Ambulatory Visit (INDEPENDENT_AMBULATORY_CARE_PROVIDER_SITE_OTHER): Payer: BC Managed Care – PPO | Admitting: Bariatrics

## 2017-11-18 VITALS — BP 94/68 | HR 81 | Temp 97.6°F | Ht 62.0 in | Wt 240.0 lb

## 2017-11-18 DIAGNOSIS — I10 Essential (primary) hypertension: Secondary | ICD-10-CM

## 2017-11-18 DIAGNOSIS — F3289 Other specified depressive episodes: Secondary | ICD-10-CM

## 2017-11-18 DIAGNOSIS — Z6841 Body Mass Index (BMI) 40.0 and over, adult: Secondary | ICD-10-CM

## 2017-11-20 DIAGNOSIS — Z6841 Body Mass Index (BMI) 40.0 and over, adult: Secondary | ICD-10-CM

## 2017-11-20 DIAGNOSIS — I10 Essential (primary) hypertension: Secondary | ICD-10-CM | POA: Insufficient documentation

## 2017-11-20 NOTE — Progress Notes (Signed)
Office: [phone]  /  Fax: [phone]   HPI:   Chief Complaint: OBESITY Tracy Beard is here to discuss her progress with her obesity treatment plan. She is on the Category 3 plan and is following her eating plan approximately 90 % of the time. She states she is doing cardio and weight lifting for 60 minutes 4 times per week. Tracy Beard is not having any struggles and hunger and cravings are ok.  Her weight is 240 lb (108.9 kg) today and has had a weight loss of 7 pounds over a period of 2 weeks since her last visit. She has lost 24 lbs since starting treatment with [place].  Hypertension Tracy Beard is a 57 y.o. female with hypertension. She is working on weight loss to help control her blood pressure with the goal of decreasing her risk of heart attack and stroke. She is taking lisinopril HCTZ 20-25 and she does not want to decrease the dose at this time. Tracy Beard's blood pressure is well controlled. Tracy Beard denies hypotension or lightheadedness.  Depression with emotional eating behaviors Tracy Beard is struggling with emotional eating and using food for comfort to the extent that it is negatively impacting her health. She is taking bupropion 150mg  and is not having any side effects.  ALLERGIES: No Known Allergies  MEDICATIONS: Current Outpatient Medications on File Prior to Visit  Medication Sig Dispense Refill  . buPROPion (WELLBUTRIN SR) 150 MG 12 hr tablet Take 1 tablet (150 mg total) by mouth daily. 30 tablet 0  . Calcium Carbonate-Vitamin D (CALCIUM 600+D PO) Take 2 tablets by mouth daily.    . Cholecalciferol (VITAMIN D3) 2000 units TABS Take 1 capsule by mouth daily.    . diphenhydrAMINE (SOMINEX) 25 MG tablet Take 25 mg by mouth at bedtime as needed for sleep.    Marland Kitchen esomeprazole (NEXIUM) 40 MG capsule Take 40 mg by mouth daily at 12 noon.    Marland Kitchen lisinopril-hydrochlorothiazide (PRINZIDE,ZESTORETIC) 20-25 MG tablet Take 1 tablet by mouth daily.    . magnesium gluconate  (MAGONATE) 500 MG tablet Take 500 mg by mouth daily.    . Multiple Vitamins-Minerals (MULTIVITAMIN ADULT PO) Take 1 tablet by mouth daily.     No current facility-administered medications on file prior to visit.     PAST MEDICAL HISTORY: Past Medical History:  Diagnosis Date  . Acid reflux   . HTN (hypertension)     PAST SURGICAL HISTORY: No past surgical history on file.  SOCIAL HISTORY: Social History   Tobacco Use  . Smoking status: Former Smoker    Packs/day: 1.00    Years: 24.00    Pack years: 24.00    Last attempt to quit: 1997    Years since quitting: 22.8  . Smokeless tobacco: Never Used  Substance Use Topics  . Alcohol use: No    Frequency: Never  . Drug use: No    FAMILY HISTORY: Family History  Problem Relation Age of Onset  . Hypertension Mother   . Hypertension Father     ROS: Review of Systems  Constitutional: Positive for weight loss.  Cardiovascular:       Negative for hypotension.  Neurological:       Negative for lightheadedness.  Psychiatric/Behavioral: Positive for depression.    PHYSICAL EXAM: Blood pressure 94/68, pulse 81, temperature 97.6 F (36.4 C), temperature source Oral, height 5\' 2"  (1.575 m), weight 240 lb (108.9 kg), SpO2 99 %. Body mass index is 43.9 kg/m. Physical Exam  Constitutional: She is oriented to  person, place, and time. She appears well-developed and well-nourished.  Cardiovascular: Normal rate.  Pulmonary/Chest: Effort normal.  Musculoskeletal: Normal range of motion.  Neurological: She is oriented to person, place, and time.  Skin: Skin is warm and dry.  Psychiatric: She has a normal mood and affect. Her behavior is normal.  Vitals reviewed.   RECENT LABS AND TESTS: BMET    Component Value Date/Time   NA 137 10/21/2017 0902   K 4.6 10/21/2017 0902   CL 102 10/21/2017 0902   CO2 20 10/21/2017 0902   GLUCOSE 97 10/21/2017 0902   BUN 51 (H) 10/21/2017 0902   CREATININE 1.33 (H) 10/21/2017 0902    CALCIUM 9.6 10/21/2017 0902   GFRNONAA 44 (L) 10/21/2017 0902   GFRAA 51 (L) 10/21/2017 0902   Lab Results  Component Value Date   HGBA1C 5.3 10/21/2017   HGBA1C 5.3 07/11/2017   HGBA1C 5.3 02/25/2017   Lab Results  Component Value Date   INSULIN 9.5 10/21/2017   INSULIN 8.5 07/11/2017   INSULIN 13.3 02/25/2017   CBC    Component Value Date/Time   WBC 9.5 02/25/2017 0938   RBC 4.16 02/25/2017 0938   HGB 13.0 02/25/2017 0938   HCT 38.3 02/25/2017 0938   MCV 92 02/25/2017 0938   MCH 31.3 02/25/2017 0938   MCHC 33.9 02/25/2017 0938   RDW 13.1 02/25/2017 0938   LYMPHSABS 2.1 02/25/2017 0938   EOSABS 0.2 02/25/2017 0938   BASOSABS 0.0 02/25/2017 0938   Iron/TIBC/Ferritin/ %Sat No results found for: IRON, TIBC, FERRITIN, IRONPCTSAT Lipid Panel     Component Value Date/Time   CHOL 168 07/11/2017 0819   TRIG 100 07/11/2017 0819   HDL 47 07/11/2017 0819   LDLCALC 101 (H) 07/11/2017 0819   Hepatic Function Panel     Component Value Date/Time   PROT 6.4 10/21/2017 0902   ALBUMIN 4.4 10/21/2017 0902   AST 12 10/21/2017 0902   ALT 19 10/21/2017 0902   ALKPHOS 57 10/21/2017 0902   BILITOT <0.2 10/21/2017 0902      Component Value Date/Time   TSH 3.380 02/25/2017 0938   Results for LISETH, WANN (MRN [id number]) as of 11/20/2017 07:37  Ref. Range 10/21/2017 09:02  Vitamin D, 25-Hydroxy Latest Ref Range: 30.0 - 100.0 ng/mL 59.6   ASSESSMENT AND PLAN: Essential hypertension  Other depression - with emotional eating  Class 3 severe obesity with serious comorbidity and body mass index (BMI) of 40.0 to 44.9 in adult, unspecified obesity type (HCC)  PLAN:  Hypertension We discussed sodium restriction, working on healthy weight loss, and a regular exercise program as the means to achieve improved blood pressure control. We will continue to monitor her blood pressure as well as her progress with the above lifestyle modifications. She will not decrease her  antihypertensive medications at this time and she is aware to tell us if she has signs and symptoms of hypotension. Tracy Beard agreed with this plan and agreed to follow up as directed in 2 weeks  Depression with Emotional Eating Behaviors We discussed behavior modification techniques today to help Tracy Beard deal with her emotional eating and depression. She has agreed to continue to take Wellbutrin SR 150 mg qd and agreed to follow up as directed.  I spent > than 50% of the 15 minute visit on counseling as documented in the note.  Obesity Tracy Beard is currently in the action stage of change. As such, her goal is to continue with weight loss efforts. She has agreed to  continue with the Category 3 plan and meal planning. Tracy Beard has been instructed to work up to a goal of 150 minutes of combined cardio and strengthening exercise per week for weight loss and overall health benefits. We discussed the following Behavioral Modification Strategies today: increasing lean protein intake, decreasing simple carbohydrates, increasing vegetables, increase H2O intake, no skipping meals, and work on meal planning and easy cooking plans.  Tracy Beard has agreed to follow up with our clinic in 2 weeks. She was informed of the importance of frequent follow up visits to maximize her success with intensive lifestyle modifications for her multiple health conditions.   OBESITY BEHAVIORAL INTERVENTION VISIT  Today's visit was # 18   Starting weight: 264 lbs Starting date: 02/25/17 Today's weight : Weight: 240 lb (108.9 kg)  Today's date: 11/18/2017 Total lbs lost to date: 24  ASK: We discussed the diagnosis of obesity with [name] today and Tracy Beard agreed to give [place] permission to discuss obesity behavioral modification therapy today.  ASSESS: Tracy Beard has the diagnosis of obesity and her BMI today is 43.89. Tracy Beard is in the action stage of change.   ADVISE: Tracy Beard was educated on the multiple  health risks of obesity as well as the benefit of weight loss to improve her health. She was advised of the need for long term treatment and the importance of lifestyle modifications to improve her current health and to decrease her risk of future health problems.  AGREE: Multiple dietary modification options and treatment options were discussed and Tracy Beard agreed to follow the recommendations documented in the above note.  ARRANGE: Tracy Beard was educated on the importance of frequent visits to treat obesity as outlined per CMS and USPSTF guidelines and agreed to schedule her next follow up appointment today.  I, Kirke Corin, am acting as Energy manager for [employer]. Manson Passey, DO  I have reviewed the above documentation for accuracy and completeness, and I agree with the above. -Corinna Capra, DO

## 2017-12-04 ENCOUNTER — Ambulatory Visit (INDEPENDENT_AMBULATORY_CARE_PROVIDER_SITE_OTHER): Payer: BC Managed Care – PPO | Admitting: Bariatrics

## 2017-12-04 VITALS — BP 93/68 | HR 85 | Temp 96.8°F | Ht 62.0 in | Wt 245.0 lb

## 2017-12-04 DIAGNOSIS — F3289 Other specified depressive episodes: Secondary | ICD-10-CM | POA: Diagnosis not present

## 2017-12-04 DIAGNOSIS — I10 Essential (primary) hypertension: Secondary | ICD-10-CM | POA: Diagnosis not present

## 2017-12-04 DIAGNOSIS — Z9189 Other specified personal risk factors, not elsewhere classified: Secondary | ICD-10-CM

## 2017-12-04 DIAGNOSIS — Z6841 Body Mass Index (BMI) 40.0 and over, adult: Secondary | ICD-10-CM

## 2017-12-04 MED ORDER — BUPROPION HCL ER (SR) 200 MG PO TB12: 200.0000 mg | ORAL_TABLET | Freq: Every day | ORAL | 0 refills | Status: DC

## 2017-12-05 ENCOUNTER — Encounter (INDEPENDENT_AMBULATORY_CARE_PROVIDER_SITE_OTHER): Payer: Self-pay | Admitting: Bariatrics

## 2017-12-05 NOTE — Progress Notes (Signed)
Office: [phone]  /  Fax: [phone]   HPI:   Chief Complaint: OBESITY Tracy Beard is here to discuss her progress with her obesity treatment plan. She is on the Category 3 plan and is following her eating plan approximately 50 % of the time. She states she is weight lifting 60 minutes 4 times per week. Tracy Beard states that she has not been following the plan, but she will get back on track.  Her weight is   today and has had a weight gain of 5 pounds over a period of 2 weeks since her last visit. She has lost 19 lbs since starting treatment with [place].  Depression with emotional eating behaviors Tracy Beard is struggling with emotional eating and using food for comfort to the extent that it is negatively impacting her health. She often snacks when she is not hungry. Tracy Beard sometimes feels she is out of control and then feels guilty that she made poor food choices. She shows no sign of suicidal or homicidal ideations.  Hypertension Tracy Beard is a 58 y.o. female with hypertension. Tracy Beard's blood pressure is currently well controlled and slightly low, but she denies hypotension or lightheadedness.She is working on weight loss to help control her blood pressure with the goal of decreasing her risk of heart attack and stroke.  At risk for cardiovascular disease Tracy Beard is at a higher than average risk for cardiovascular disease due to hypertension and obesity. She currently denies any chest pain.  ALLERGIES: No Known Allergies  MEDICATIONS: Current Outpatient Medications on File Prior to Visit  Medication Sig Dispense Refill  . Calcium Carbonate-Vitamin D (CALCIUM 600+D PO) Take 2 tablets by mouth daily.    . Cholecalciferol (VITAMIN D3) 2000 units TABS Take 1 capsule by mouth daily.    . diphenhydrAMINE (SOMINEX) 25 MG tablet Take 25 mg by mouth at bedtime as needed for sleep.    Marland Kitchen esomeprazole (NEXIUM) 40 MG capsule Take 40 mg by mouth daily at 12 noon.    Marland Kitchen  lisinopril-hydrochlorothiazide (PRINZIDE,ZESTORETIC) 20-25 MG tablet Take 1 tablet by mouth daily.    . magnesium gluconate (MAGONATE) 500 MG tablet Take 500 mg by mouth daily.    . Multiple Vitamins-Minerals (MULTIVITAMIN ADULT PO) Take 1 tablet by mouth daily.     No current facility-administered medications on file prior to visit.     PAST MEDICAL HISTORY: Past Medical History:  Diagnosis Date  . Acid reflux   . HTN (hypertension)     PAST SURGICAL HISTORY: No past surgical history on file.  SOCIAL HISTORY: Social History   Tobacco Use  . Smoking status: Former Smoker    Packs/day: 1.00    Years: 24.00    Pack years: 24.00    Last attempt to quit: 1997    Years since quitting: 22.8  . Smokeless tobacco: Never Used  Substance Use Topics  . Alcohol use: No    Frequency: Never  . Drug use: No    FAMILY HISTORY: Family History  Problem Relation Age of Onset  . Hypertension Mother   . Hypertension Father     ROS: Review of Systems  Constitutional: Negative for weight loss.  Cardiovascular: Negative for chest pain.       Negative for hypotension.  Neurological:       Negative for lightheadedness.  Psychiatric/Behavioral: Positive for depression. Negative for suicidal ideas.       Negative for homicidal ideations.    PHYSICAL EXAM: Blood pressure 93/68, pulse 85, temperature (!) 96.8  F (36 C), temperature source Oral, SpO2 98 %. There is no height or weight on file to calculate BMI. Physical Exam  Constitutional: She is oriented to person, place, and time. She appears well-developed and well-nourished.  Cardiovascular: Normal rate.  Pulmonary/Chest: Effort normal.  Musculoskeletal: Normal range of motion.  Neurological: She is oriented to person, place, and time.  Skin: Skin is warm and dry.  Psychiatric: She has a normal mood and affect. Her behavior is normal.  Vitals reviewed.   RECENT LABS AND TESTS: BMET    Component Value Date/Time   NA 137  10/21/2017 0902   K 4.6 10/21/2017 0902   CL 102 10/21/2017 0902   CO2 20 10/21/2017 0902   GLUCOSE 97 10/21/2017 0902   BUN 51 (H) 10/21/2017 0902   CREATININE 1.33 (H) 10/21/2017 0902   CALCIUM 9.6 10/21/2017 0902   GFRNONAA 44 (L) 10/21/2017 0902   GFRAA 51 (L) 10/21/2017 0902   Lab Results  Component Value Date   HGBA1C 5.3 10/21/2017   HGBA1C 5.3 07/11/2017   HGBA1C 5.3 02/25/2017   Lab Results  Component Value Date   INSULIN 9.5 10/21/2017   INSULIN 8.5 07/11/2017   INSULIN 13.3 02/25/2017   CBC    Component Value Date/Time   WBC 9.5 02/25/2017 0938   RBC 4.16 02/25/2017 0938   HGB 13.0 02/25/2017 0938   HCT 38.3 02/25/2017 0938   MCV 92 02/25/2017 0938   MCH 31.3 02/25/2017 0938   MCHC 33.9 02/25/2017 0938   RDW 13.1 02/25/2017 0938   LYMPHSABS 2.1 02/25/2017 0938   EOSABS 0.2 02/25/2017 0938   BASOSABS 0.0 02/25/2017 0938   Iron/TIBC/Ferritin/ %Sat No results found for: IRON, TIBC, FERRITIN, IRONPCTSAT Lipid Panel     Component Value Date/Time   CHOL 168 07/11/2017 0819   TRIG 100 07/11/2017 0819   HDL 47 07/11/2017 0819   LDLCALC 101 (H) 07/11/2017 0819   Hepatic Function Panel     Component Value Date/Time   PROT 6.4 10/21/2017 0902   ALBUMIN 4.4 10/21/2017 0902   AST 12 10/21/2017 0902   ALT 19 10/21/2017 0902   ALKPHOS 57 10/21/2017 0902   BILITOT <0.2 10/21/2017 0902      Component Value Date/Time   TSH 3.380 02/25/2017 0938   Results for Tracy, Beard (MRN [id number]) as of 12/05/2017 06:14  Ref. Range 10/21/2017 09:02  Vitamin D, 25-Hydroxy Latest Ref Range: 30.0 - 100.0 ng/mL 59.6   ASSESSMENT AND PLAN: Essential hypertension  Other depression - with emotional eating - Plan: buPROPion (WELLBUTRIN SR) 200 MG 12 hr tablet  At risk for heart disease  Class 3 severe obesity with serious comorbidity and body mass index (BMI) of 40.0 to 44.9 in adult, unspecified obesity type (HCC)  PLAN:  Hypertension We discussed sodium  restriction, working on healthy weight loss, and a regular exercise program as the means to achieve improved blood pressure control. We will continue to monitor her blood pressure as well as her progress with the above lifestyle modifications. She will continue her antihypertensive medications as prescribed and will watch for signs of hypotension as she continues her lifestyle modifications. Tracy Beard agreed with this plan and agreed to follow up as directed in 2 weeks.  Cardiovascular risk counseling Tracy Beard was given extended (15 minutes) coronary artery disease prevention counseling today. She is 57 y.o. female and has risk factors for heart disease including hypertension and obesity. We discussed intensive lifestyle modifications today with an emphasis on specific weight loss  instructions and strategies. Pt was also informed of the importance of increasing exercise and decreasing saturated fats to help prevent heart disease.  Depression with Emotional Eating Behaviors We discussed behavior modification techniques today to help Tracy Beard deal with her emotional eating and depression. She has agreed to take bupropion 12 hour SR 200mg , 1 PO qd #30 with no refills and agreed to follow up as directed in 2 weeks.  Obesity Tracy Beard is currently in the action stage of change. As such, her goal is to continue with weight loss efforts. She has agreed to follow the Category 3 plan and to do more meal planning. Tracy Beard has been instructed to work up to a goal of 150 minutes of combined cardio and strengthening exercise per week for weight loss and overall health benefits. We discussed the following Behavioral Modification Strategies today: increasing lean protein intake, decreasing simple carbohydrates, increasing vegetables, increase H2O intake, decrease eating out, and no skipping meals.  Tracy Beard has agreed to follow up with our clinic in 2 weeks. She was informed of the importance of frequent follow up  visits to maximize her success with intensive lifestyle modifications for her multiple health conditions.   OBESITY BEHAVIORAL INTERVENTION VISIT  Today's visit was # 18   Starting weight: 264 Starting date: 02/25/17 Today's weight :  245 lbs  Today's date: 12/04/2017 Total lbs lost to date: 22  ASK: We discussed the diagnosis of obesity with Tracy Beard today and Tracy Beard agreed to give [place] permission to discuss obesity behavioral modification therapy today.  ASSESS: Tracy Beard has the diagnosis of obesity and her BMI today is 44.8. Tracy Beard is in the action stage of change.   ADVISE: Tracy Beard was educated on the multiple health risks of obesity as well as the benefit of weight loss to improve her health. She was advised of the need for long term treatment and the importance of lifestyle modifications to improve her current health and to decrease her risk of future health problems.  AGREE: Multiple dietary modification options and treatment options were discussed and Tracy Beard agreed to follow the recommendations documented in the above note.  ARRANGE: Tracy Beard was educated on the importance of frequent visits to treat obesity as outlined per CMS and USPSTF guidelines and agreed to schedule her next follow up appointment today.  I, Kirke Corin, am acting as Energy manager for [employer]. Manson Passey, DO  I have reviewed the above documentation for accuracy and completeness, and I agree with the above. -Corinna Capra, DO

## 2017-12-18 ENCOUNTER — Ambulatory Visit (INDEPENDENT_AMBULATORY_CARE_PROVIDER_SITE_OTHER): Payer: BC Managed Care – PPO | Admitting: Family Medicine

## 2017-12-18 VITALS — BP 109/76 | HR 80 | Temp 97.9°F | Ht 62.0 in | Wt 245.0 lb

## 2017-12-18 DIAGNOSIS — Z9189 Other specified personal risk factors, not elsewhere classified: Secondary | ICD-10-CM | POA: Diagnosis not present

## 2017-12-18 DIAGNOSIS — E66813 Obesity, class 3: Secondary | ICD-10-CM

## 2017-12-18 DIAGNOSIS — I10 Essential (primary) hypertension: Secondary | ICD-10-CM

## 2017-12-18 DIAGNOSIS — Z6841 Body Mass Index (BMI) 40.0 and over, adult: Secondary | ICD-10-CM | POA: Diagnosis not present

## 2017-12-18 DIAGNOSIS — F3289 Other specified depressive episodes: Secondary | ICD-10-CM | POA: Diagnosis not present

## 2017-12-18 MED ORDER — BUPROPION HCL ER (SR) 200 MG PO TB12: 200.0000 mg | ORAL_TABLET | Freq: Every day | ORAL | 0 refills | Status: DC

## 2017-12-18 NOTE — Progress Notes (Signed)
[place],

## 2017-12-23 ENCOUNTER — Encounter (INDEPENDENT_AMBULATORY_CARE_PROVIDER_SITE_OTHER): Payer: Self-pay | Admitting: Family Medicine

## 2017-12-23 NOTE — Progress Notes (Signed)
Office: [phone]  /  Fax: [phone]   HPI:   Chief Complaint: OBESITY Tracy Beard is here to discuss her progress with her obesity treatment plan. She is on the Category 3 plan and is following her eating plan approximately 70% of the time. She states she did cardio and weights 2 days at the gym this week 60 minutes 2 times this week. Tracy Beard had family Thanksgiving celebration last week. She is back on track now. She is eating all of the food mostly however she doesn't eat all of her bread. Her weight is 245 lb (111.1 kg) today and she has maintained weight over a period of 2 weeks since her last visit. She has lost 19 lbs since starting treatment with [place].  Hypertension Tracy Beard is a 57 y.o. female with hypertension which is well-controlled. Tracy Beard denies chest pain, headache or shortness of breath on exertion. She is working weight loss to help control her blood pressure with the goal of decreasing her risk of heart attack and stroke. Tracy Beard blood pressure is well controlled.  Depression with emotional eating behaviors Tracy Beard is on Bupropion currently and she thinks that Bupropion helps with her cravings. She struggles with emotional eating and using food for comfort to the extent that it is negatively impacting her health. She often snacks when she is not hungry. Tracy Beard sometimes feels she is out of control and then feels guilty that she made poor food choices. She has been working on behavior modification techniques to help reduce her emotional eating and has been somewhat successful. She shows no sign of suicidal or homicidal ideations.  Depression screen Tracy Beard 2/9 02/25/2017  Decreased Interest 2  Down, Depressed, Hopeless 0  PHQ - 2 Score 2  Altered sleeping 1  Tired, decreased energy 2  Change in appetite 1  Feeling bad or failure about yourself  0  Trouble concentrating 0  Moving slowly or fidgety/restless 0  PHQ-9 Score 6  Difficult doing  work/chores Not difficult at all      ALLERGIES: No Known Allergies  MEDICATIONS: Current Outpatient Medications on File Prior to Visit  Medication Sig Dispense Refill   Calcium Carbonate-Vitamin D (CALCIUM 600+D PO) Take 2 tablets by mouth daily.     Cholecalciferol (VITAMIN D3) 2000 units TABS Take 1 capsule by mouth daily.     diphenhydrAMINE (SOMINEX) 25 MG tablet Take 25 mg by mouth at bedtime as needed for sleep.     esomeprazole (NEXIUM) 40 MG capsule Take 40 mg by mouth daily at 12 noon.     lisinopril-hydrochlorothiazide (PRINZIDE,ZESTORETIC) 20-25 MG tablet Take 1 tablet by mouth daily.     magnesium gluconate (MAGONATE) 500 MG tablet Take 500 mg by mouth daily.     Multiple Vitamins-Minerals (MULTIVITAMIN ADULT PO) Take 1 tablet by mouth daily.     No current facility-administered medications on file prior to visit.     PAST MEDICAL HISTORY: Past Medical History:  Diagnosis Date   Acid reflux    HTN (hypertension)     PAST SURGICAL HISTORY: No past surgical history on file.  SOCIAL HISTORY: Social History   Tobacco Use   Smoking status: Former Smoker    Packs/day: 1.00    Years: 24.00    Pack years: 24.00    Last attempt to quit: 1997    Years since quitting: 22.9   Smokeless tobacco: Never Used  Substance Use Topics   Alcohol use: No    Frequency: Never   Drug use:  No    FAMILY HISTORY: Family History  Problem Relation Age of Onset   Hypertension Mother    Hypertension Father     ROS: Review of Systems  Constitutional: Negative for weight loss.  Respiratory: Negative for shortness of breath (on exertion).   Cardiovascular: Negative for chest pain.  Neurological: Negative for headaches.  Endo/Heme/Allergies:       + Cravings  Psychiatric/Behavioral: Positive for depression. Negative for suicidal ideas.    PHYSICAL EXAM: Blood pressure 109/76, pulse 80, temperature 97.9 F (36.6 C), temperature source Oral, height 5\' 2"   (1.575 m), weight 245 lb (111.1 kg), SpO2 98 %. Body mass index is 44.81 kg/m. Physical Exam  Constitutional: She is oriented to person, place, and time. She appears well-developed and well-nourished.  Cardiovascular: Normal rate.  Pulmonary/Chest: Effort normal.  Musculoskeletal: Normal range of motion.  Neurological: She is oriented to person, place, and time.  Skin: Skin is warm and dry.  Psychiatric: She has a normal mood and affect. Her behavior is normal. She expresses no homicidal and no suicidal ideation.  Vitals reviewed.   RECENT LABS AND TESTS: BMET    Component Value Date/Time   NA 137 10/21/2017 0902   K 4.6 10/21/2017 0902   CL 102 10/21/2017 0902   CO2 20 10/21/2017 0902   GLUCOSE 97 10/21/2017 0902   BUN 51 (H) 10/21/2017 0902   CREATININE 1.33 (H) 10/21/2017 0902   CALCIUM 9.6 10/21/2017 0902   GFRNONAA 44 (L) 10/21/2017 0902   GFRAA 51 (L) 10/21/2017 0902   Lab Results  Component Value Date   HGBA1C 5.3 10/21/2017   HGBA1C 5.3 07/11/2017   HGBA1C 5.3 02/25/2017   Lab Results  Component Value Date   INSULIN 9.5 10/21/2017   INSULIN 8.5 07/11/2017   INSULIN 13.3 02/25/2017   CBC    Component Value Date/Time   WBC 9.5 02/25/2017 0938   RBC 4.16 02/25/2017 0938   HGB 13.0 02/25/2017 0938   HCT 38.3 02/25/2017 0938   MCV 92 02/25/2017 0938   MCH 31.3 02/25/2017 0938   MCHC 33.9 02/25/2017 0938   RDW 13.1 02/25/2017 0938   LYMPHSABS 2.1 02/25/2017 0938   EOSABS 0.2 02/25/2017 0938   BASOSABS 0.0 02/25/2017 0938   Iron/TIBC/Ferritin/ %Sat No results found for: IRON, TIBC, FERRITIN, IRONPCTSAT Lipid Panel     Component Value Date/Time   CHOL 168 07/11/2017 0819   TRIG 100 07/11/2017 0819   HDL 47 07/11/2017 0819   LDLCALC 101 (H) 07/11/2017 0819   Hepatic Function Panel     Component Value Date/Time   PROT 6.4 10/21/2017 0902   ALBUMIN 4.4 10/21/2017 0902   AST 12 10/21/2017 0902   ALT 19 10/21/2017 0902   ALKPHOS 57 10/21/2017 0902     BILITOT <0.2 10/21/2017 0902      Component Value Date/Time   TSH 3.380 02/25/2017 0938   Results for Tracy Beard (MRN [id number]) as of 12/23/2017 15:18  Ref. Range 10/21/2017 09:02  Vitamin D, 25-Hydroxy Latest Ref Range: 30.0 - 100.0 ng/mL 59.6   ASSESSMENT AND PLAN: Essential hypertension  Other depression - with emotional eating - Plan: buPROPion (WELLBUTRIN SR) 200 MG 12 hr tablet  Class 3 severe obesity with serious comorbidity and body mass index (BMI) of 40.0 to 44.9 in adult, unspecified obesity type (HCC)  PLAN:  Hypertension We discussed sodium restriction, working on healthy weight loss, and a regular exercise program as the means to achieve improved blood pressure control. Tracy Beard agreed  with this plan and agreed to follow up as directed. We will continue to monitor her blood pressure as well as her progress with the above lifestyle modifications. She will continue her anti-hypertensive medications as prescribed and will watch for signs of hypotension as she continues her lifestyle modifications.  Depression with Emotional Eating Behaviors We discussed behavior modification techniques today to help Tracy Beard deal with her emotional eating and depression. She has agreed to continue Bupropion SR 200 mg qd in the morning #30 with no refills and follow up as directed.  Obesity Tracy Beard is currently in the action stage of change. As such, her goal is to continue with weight loss efforts She has agreed to follow the Category 3 plan Tracy Beard has been instructed to continue doing cardio and weights at the gym 60 minutes 2 times per week for weight loss and overall health benefits. We discussed the following Behavioral Modification Strategies today: holiday eating strategies and planning for success  Tracy Beard has agreed to follow up with our clinic in 2 to 3 weeks. She was informed of the importance of frequent follow up visits to maximize her success with intensive  lifestyle modifications for her multiple health conditions.   OBESITY BEHAVIORAL INTERVENTION VISIT  Today's visit was # 19  Starting weight: 264 lbs Starting date: 02/25/2017 Today's weight : 245 lbs Today's date: 12/18/2017 Total lbs lost to date: 6719   ASK: We discussed the diagnosis of obesity with Tracy Beard today and Tracy Beard agreed to give us permission to discuss obesity behavioral modification therapy today.  ASSESS: Tracy Beard has the diagnosis of obesity and her BMI today is 5944.8 Tracy Beard is in the action stage of change   ADVISE: Tracy Beard was educated on the multiple health risks of obesity as well as the benefit of weight loss to improve her health. She was advised of the need for long term treatment and the importance of lifestyle modifications to improve her current health and to decrease her risk of future health problems.  AGREE: Multiple dietary modification options and treatment options were discussed and  Tracy Beard agreed to follow the recommendations documented in the above note.  ARRANGE: Tracy Beard was educated on the importance of frequent visits to treat obesity as outlined per CMS and USPSTF guidelines and agreed to schedule her next follow up appointment today.  Cristi LoronI, Joanne Murray, am acting as Energy managertranscriptionist for [place], FNP-C.  I have reviewed the above documentation for accuracy and completeness, and I agree with the above.  - Dawn Whitmire, FNP-C.

## 2018-01-06 ENCOUNTER — Encounter (INDEPENDENT_AMBULATORY_CARE_PROVIDER_SITE_OTHER): Payer: Self-pay | Admitting: Family Medicine

## 2018-01-06 ENCOUNTER — Ambulatory Visit (INDEPENDENT_AMBULATORY_CARE_PROVIDER_SITE_OTHER): Payer: BC Managed Care – PPO | Admitting: Family Medicine

## 2018-01-06 VITALS — BP 105/72 | HR 89 | Temp 97.4°F | Ht 62.0 in | Wt 241.0 lb

## 2018-01-06 DIAGNOSIS — Z6841 Body Mass Index (BMI) 40.0 and over, adult: Secondary | ICD-10-CM | POA: Diagnosis not present

## 2018-01-06 DIAGNOSIS — I10 Essential (primary) hypertension: Secondary | ICD-10-CM

## 2018-01-06 DIAGNOSIS — F3289 Other specified depressive episodes: Secondary | ICD-10-CM | POA: Diagnosis not present

## 2018-01-06 NOTE — Progress Notes (Signed)
Office: [phone]  /  Fax: [phone]   HPI:   Chief Complaint: OBESITY Tracy JohnsKathleen is here to discuss her progress with her obesity treatment plan. She is on the  follow the Category 3 plan and is following her eating plan approximately 75 % of the time. She states she is exercising by doing aerobics and weight lifting for 60 minutes 3-5 times per week. Tracy JohnsKathleen indulged over Thanksgiving but has worked hard to get back on track. Her starting weight was 300 lb before coming to clinic. She has had overall weight loss of 59 lbs since starting her weight loss journey.  Her weight is 241 lb (109.3 kg) today and has had a weight loss of 4 pounds over a period of 3 weeks since her last visit. She has lost 23 lbs since starting treatment with us.  Hypertension [name] is a 57 y.o. female with hypertension.  [name] Laprade denies chest pain, dizziness, or shortness of breath on exertion. She is working weight loss to help control her blood pressure with the goal of decreasing her risk of heart attack and stroke. Tracy Beard blood pressure is low today. She reports blood pressure increases throughout the day. She has trialed off of medications before and blood pressure was very high. She will monitor for hypotension.   Depression with emotional eating behaviors Tracy JohnsKathleen is struggling with emotional eating and using food for comfort to the extent that it is negatively impacting her health. She often snacks when she is not hungry. Tracy JohnsKathleen sometimes feels she is out of control and then feels guilty that she made poor food choices. She has been working on behavior modification techniques to help reduce her emotional eating and has been somewhat successful. She reports the Wellbutrin is helping as stress eating has decreased with medication.  She shows no sign of suicidal or homicidal ideations.  [facility]  Decreased Interest 2  Down, Depressed, Hopeless 0  PHQ  - 2 Score 2  Altered sleeping 1  Tired, decreased energy 2  Change in appetite 1  Feeling bad or failure about yourself  0  Trouble concentrating 0  Moving slowly or fidgety/restless 0  PHQ-9 Score 6  Difficult doing work/chores Not difficult at all      ALLERGIES: No Known Allergies  MEDICATIONS: Current Outpatient Medications on File Prior to Visit  Medication Sig Dispense Refill  . buPROPion (WELLBUTRIN SR) 200 MG 12 hr tablet Take 1 tablet (200 mg total) by mouth daily. 30 tablet 0  . Calcium Carbonate-Vitamin D (CALCIUM 600+D PO) Take 2 tablets by mouth daily.    . Cholecalciferol (VITAMIN D3) 2000 units TABS Take 1 capsule by mouth daily.    . diphenhydrAMINE (SOMINEX) 25 MG tablet Take 25 mg by mouth at bedtime as needed for sleep.    Marland Kitchen. esomeprazole (NEXIUM) 40 MG capsule Take 40 mg by mouth daily at 12 noon.    Marland Kitchen. lisinopril-hydrochlorothiazide (PRINZIDE,ZESTORETIC) 20-25 MG tablet Take 1 tablet by mouth daily.    . magnesium gluconate (MAGONATE) 500 MG tablet Take 500 mg by mouth daily.    . Multiple Vitamins-Minerals (MULTIVITAMIN ADULT PO) Take 1 tablet by mouth daily.     No current facility-administered medications on file prior to visit.     PAST MEDICAL HISTORY: Past Medical History:  Diagnosis Date  . Acid reflux   . HTN (hypertension)     PAST SURGICAL HISTORY: History reviewed. No pertinent surgical history.  SOCIAL HISTORY: Social History  Tobacco Use  . Smoking status: Former Smoker    Packs/day: 1.00    Years: 24.00    Pack years: 24.00    Last attempt to quit: 1997    Years since quitting: 22.9  . Smokeless tobacco: Never Used  Substance Use Topics  . Alcohol use: No    Frequency: Never  . Drug use: No    FAMILY HISTORY: Family History  Problem Relation Age of Onset  . Hypertension Mother   . Hypertension Father     ROS: Review of Systems  Constitutional: Positive for weight loss.  Respiratory: Negative for shortness of breath.    Cardiovascular: Negative for chest pain.  Neurological: Negative for dizziness.  Psychiatric/Behavioral: Positive for depression. Negative for suicidal ideas.       Negative for homicidal ideations    PHYSICAL EXAM: Blood pressure 105/72, pulse 89, temperature (!) 97.4 F (36.3 C), temperature source Oral, height 5\' 2"  (1.575 m), weight 241 lb (109.3 kg), SpO2 99 %. Body mass index is 44.08 kg/m. Physical Exam  Constitutional: She is oriented to person, place, and time. She appears well-developed and well-nourished.  HENT:  Head: Normocephalic.  Eyes: Pupils are equal, round, and reactive to light.  Neck: Normal range of motion.  Cardiovascular: Normal rate.  Pulmonary/Chest: Effort normal.  Musculoskeletal: Normal range of motion.  Neurological: She is alert and oriented to person, place, and time.  Skin: Skin is warm and dry.  Psychiatric: She has a normal mood and affect. Her behavior is normal.  Vitals reviewed.   RECENT LABS AND TESTS: BMET    Component Value Date/Time   NA 137 10/21/2017 0902   K 4.6 10/21/2017 0902   CL 102 10/21/2017 0902   CO2 20 10/21/2017 0902   GLUCOSE 97 10/21/2017 0902   BUN 51 (H) 10/21/2017 0902   CREATININE 1.33 (H) 10/21/2017 0902   CALCIUM 9.6 10/21/2017 0902   GFRNONAA 44 (L) 10/21/2017 0902   GFRAA 51 (L) 10/21/2017 0902   Lab Results  Component Value Date   HGBA1C 5.3 10/21/2017   HGBA1C 5.3 07/11/2017   HGBA1C 5.3 02/25/2017   Lab Results  Component Value Date   INSULIN 9.5 10/21/2017   INSULIN 8.5 07/11/2017   INSULIN 13.3 02/25/2017   CBC    Component Value Date/Time   WBC 9.5 02/25/2017 0938   RBC 4.16 02/25/2017 0938   HGB 13.0 02/25/2017 0938   HCT 38.3 02/25/2017 0938   MCV 92 02/25/2017 0938   MCH 31.3 02/25/2017 0938   MCHC 33.9 02/25/2017 0938   RDW 13.1 02/25/2017 0938   LYMPHSABS 2.1 02/25/2017 0938   EOSABS 0.2 02/25/2017 0938   BASOSABS 0.0 02/25/2017 0938   Iron/TIBC/Ferritin/ %Sat No results  found for: IRON, TIBC, FERRITIN, IRONPCTSAT Lipid Panel     Component Value Date/Time   CHOL 168 07/11/2017 0819   TRIG 100 07/11/2017 0819   HDL 47 07/11/2017 0819   LDLCALC 101 (H) 07/11/2017 0819   Hepatic Function Panel     Component Value Date/Time   PROT 6.4 10/21/2017 0902   ALBUMIN 4.4 10/21/2017 0902   AST 12 10/21/2017 0902   ALT 19 10/21/2017 0902   ALKPHOS 57 10/21/2017 0902   BILITOT <0.2 10/21/2017 0902      Component Value Date/Time   TSH 3.380 02/25/2017 0938    ASSESSMENT AND PLAN: Essential hypertension  Other depression - with emotional eating  Class 3 severe obesity with serious comorbidity and body mass index (BMI) of  40.0 to 44.9 in adult, unspecified obesity type (HCC)  PLAN: Hypertension We discussed sodium restriction, working on healthy weight loss, and a regular exercise program as the means to achieve improved blood pressure control. Tracy Beard agreed with this plan and agreed to follow up as directed. We will continue to monitor her blood pressure as well as her progress with the above lifestyle modifications. She will continue her medications as prescribed and will watch for signs of hypotension as she continues her lifestyle modifications.  Depression with Emotional Eating Behaviors We discussed behavior modification techniques today to help Tracy Beard deal with her emotional eating and depression. She has agreed to continue to take Wellbutrin SR 150 mg qd and agreed to follow up as directed.  I spent > than 50% of the 15 minute visit on counseling as documented in the note.  Obesity Tracy Beard is currently in the action stage of change. As such, her goal is to continue with weight loss efforts She has agreed to follow the Category 3 plan Tracy Beard has been instructed to continue aerobics and weight lifting for 60 minutes 3-5 times per week for weight loss and overall health benefits. We discussed the following Behavioral Modification Strategies  today: celebration and planning for success.    Tracy Beard has agreed to follow up with our clinic in 2-3 weeks. She was informed of the importance of frequent follow up visits to maximize her success with intensive lifestyle modifications for her multiple health conditions.   OBESITY BEHAVIORAL INTERVENTION VISIT  Today's visit was # 20   Starting weight: 264 lb Starting date: 02/25/17 Today's weight : Weight: 241 lb (109.3 kg)  Today's date: 01/06/2018 Total lbs lost to date: 23 lb    ASK: We discussed the diagnosis of obesity with [name] today and Tracy Beard agreed to give [place] permission to discuss obesity behavioral modification therapy today.  ASSESS: Tracy Beard has the diagnosis of obesity and her BMI today is 44.07 Tracy Beard is in the action stage of change   ADVISE: Tracy Beard was educated on the multiple health risks of obesity as well as the benefit of weight loss to improve her health. She was advised of the need for long term treatment and the importance of lifestyle modifications to improve her current health and to decrease her risk of future health problems.  AGREE: Multiple dietary modification options and treatment options were discussed and  Tracy Beard agreed to follow the recommendations documented in the above note.  ARRANGE: Tracy Beard was educated on the importance of frequent visits to treat obesity as outlined per CMS and USPSTF guidelines and agreed to schedule her next follow up appointment today.  I, Jeralene Peters, am acting as Energy manager for [place], FNP-C.  I have reviewed the above documentation for accuracy and completeness, and I agree with the above.  - Dawn Whitmire, FNP-C.

## 2018-01-07 ENCOUNTER — Encounter (INDEPENDENT_AMBULATORY_CARE_PROVIDER_SITE_OTHER): Payer: Self-pay | Admitting: Family Medicine

## 2018-01-22 ENCOUNTER — Encounter (INDEPENDENT_AMBULATORY_CARE_PROVIDER_SITE_OTHER): Payer: Self-pay | Admitting: Family Medicine

## 2018-01-30 ENCOUNTER — Encounter (INDEPENDENT_AMBULATORY_CARE_PROVIDER_SITE_OTHER): Payer: Self-pay

## 2018-01-30 ENCOUNTER — Ambulatory Visit (INDEPENDENT_AMBULATORY_CARE_PROVIDER_SITE_OTHER): Payer: BC Managed Care – PPO | Admitting: Family Medicine

## 2018-02-03 ENCOUNTER — Ambulatory Visit (INDEPENDENT_AMBULATORY_CARE_PROVIDER_SITE_OTHER): Payer: BC Managed Care – PPO | Admitting: Family Medicine

## 2018-02-03 ENCOUNTER — Encounter (INDEPENDENT_AMBULATORY_CARE_PROVIDER_SITE_OTHER): Payer: Self-pay | Admitting: Family Medicine

## 2018-02-03 VITALS — BP 94/68 | HR 81 | Temp 97.7°F | Ht 62.0 in | Wt 238.0 lb

## 2018-02-03 DIAGNOSIS — N183 Chronic kidney disease, stage 3 unspecified: Secondary | ICD-10-CM

## 2018-02-03 DIAGNOSIS — E559 Vitamin D deficiency, unspecified: Secondary | ICD-10-CM | POA: Diagnosis not present

## 2018-02-03 DIAGNOSIS — E66813 Obesity, class 3: Secondary | ICD-10-CM

## 2018-02-03 DIAGNOSIS — Z6841 Body Mass Index (BMI) 40.0 and over, adult: Secondary | ICD-10-CM

## 2018-02-03 DIAGNOSIS — Z9189 Other specified personal risk factors, not elsewhere classified: Secondary | ICD-10-CM | POA: Diagnosis not present

## 2018-02-03 DIAGNOSIS — F3289 Other specified depressive episodes: Secondary | ICD-10-CM | POA: Diagnosis not present

## 2018-02-03 MED ORDER — BUPROPION HCL ER (SR) 200 MG PO TB12: 200.0000 mg | ORAL_TABLET | Freq: Every day | ORAL | 0 refills | Status: DC

## 2018-02-03 NOTE — Progress Notes (Signed)
Office: [phone]  /  Fax: [phone]   HPI:   Chief Complaint: OBESITY Tracy JohnsKathleen is here to discuss her progress with her obesity treatment plan. She is on the Category 3 plan and is following her eating plan approximately 100 % of the time. She states she is doing cardio and weights 90 minutes 5 times per week because she has been on school break. Tracy JohnsKathleen is doing well on her meal plan. She likes the routine of the meal plan. 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  Her weight is 238 lb (108 kg) today and has had a weight loss of 3 pounds over a period of 4 weeks since her last visit. She has lost 26 lbs since starting treatment with us.  Depression with emotional eating behaviors Tracy Beard's emotional eating is much improved. She has been working on behavior modification techniques to help reduce her emotional eating and has been somewhat successful.   Chronic Kidney Disease stage 3 [facility] GFR is 43 and her creatinine is 1.26, so kidney function is stable.  Vitamin D deficiency Tracy JohnsKathleen has a diagnosis of vitamin D deficiency. She had her vitamin D level checked recently at her PCP and was at goal of 58.3. She is currently taking OTC vit D and denies nausea, vomiting, or muscle weakness.  At risk for osteopenia and osteoporosis Tracy JohnsKathleen is at higher risk of osteopenia and osteoporosis due to vitamin D deficiency.   ASSESSMENT AND PLAN:  Stage 3 chronic kidney disease (HCC)  Vitamin D deficiency  Other depression - with emotional eating  - Plan: buPROPion (WELLBUTRIN SR) 200 MG 12 hr tablet  At risk for osteoporosis  Class 3 severe obesity with serious comorbidity and body mass index (BMI) of 40.0 to 44.9 in adult, unspecified obesity type (HCC)  PLAN:  Chronic Kidney Disease stage 3 Tracy JohnsKathleen agrees to continue with her meal plan and will follow up with us in 3 weeks.  Vitamin D Deficiency Tracy JohnsKathleen was  informed that low vitamin D levels contributes to fatigue and are associated with obesity, breast, and colon cancer. She agrees to continue to take OTC Vit D @ 2,000 IU every day and will follow up for routine testing of vitamin D, at least 2-3 times per year. She was informed of the risk of over-replacement of vitamin D and agrees to not increase her dose unless she discusses this with us first. Tracy JohnsKathleen agreed to follow up as directed.  At risk for osteopenia and osteoporosis Tracy JohnsKathleen was given extended (15 minutes) osteoporosis prevention counseling today. Tracy JohnsKathleen is at risk for osteopenia and osteoporosis due to her vitamin D deficiency. She was encouraged to take her vitamin D and follow her higher calcium diet and increase strengthening exercise to help strengthen her bones and decrease her risk of osteopenia and osteoporosis.  Depression with Emotional Eating Behaviors We discussed behavior modification techniques today to help Tracy JohnsKathleen deal with her emotional eating and depression. She has agreed to continue bupropion SR 200mg  qd #30 with no refills and agreed to follow up as directed in 3 weeks.  Obesity Tracy JohnsKathleen is currently in the action stage of change. As such, her goal is to continue with weight loss efforts She has agreed to follow the Category 3 plan Tracy JohnsKathleen has been instructed to do 60 minutes of cardio and resistance training 4 to 5 days a week. We discussed the following Behavioral Modification Strategies today: planning for success.  Tracy JohnsKathleen has agreed to follow up with our clinic in 3 weeks. She was informed of the importance of frequent follow up visits to maximize her success with intensive lifestyle modifications for her multiple health conditions.  ALLERGIES: No Known Allergies  MEDICATIONS: Current Outpatient Medications on File Prior to Visit  Medication Sig Dispense Refill  . Calcium Carbonate-Vitamin D (CALCIUM 600+D PO) Take 2 tablets by mouth daily.    .  Cholecalciferol (VITAMIN D3) 2000 units TABS Take 1 capsule by mouth daily.    . diphenhydrAMINE (SOMINEX) 25 MG tablet Take 25 mg by mouth at bedtime as needed for sleep.    Marland Kitchen. esomeprazole (NEXIUM) 40 MG capsule Take 40 mg by mouth daily at 12 noon.    Marland Kitchen. lisinopril-hydrochlorothiazide (PRINZIDE,ZESTORETIC) 20-25 MG tablet Take 1 tablet by mouth daily.    . magnesium gluconate (MAGONATE) 500 MG tablet Take 500 mg by mouth daily.    . Multiple Vitamins-Minerals (MULTIVITAMIN ADULT PO) Take 1 tablet by mouth daily.     No current facility-administered medications on file prior to visit.     PAST MEDICAL HISTORY: Past Medical History:  Diagnosis Date  . Acid reflux   . HTN (hypertension)     PAST SURGICAL HISTORY: History reviewed. No pertinent surgical history.  SOCIAL HISTORY: Social History   Tobacco Use  . Smoking status: Former Smoker    Packs/day: 1.00    Years: 24.00    Pack years: 24.00    Last attempt to quit: 1997    Years since quitting: 23.0  . Smokeless tobacco: Never Used  Substance Use Topics  . Alcohol use: No    Frequency: Never  . Drug use: No    FAMILY HISTORY: Family History  Problem Relation Age of Onset  . Hypertension Mother   . Hypertension Father     ROS: Review of Systems  Constitutional: Positive for weight loss.  Gastrointestinal: Negative for nausea and vomiting.  Musculoskeletal:       Negative for muscle weakness.  Psychiatric/Behavioral: Positive for depression.   PHYSICAL EXAM: Blood pressure 94/68, pulse 81, temperature 97.7 F (36.5 C), temperature source Oral, height 5\' 2"  (1.575 m), weight 238 lb (108 kg), SpO2 100 %. Body mass index is 43.53 kg/m. Physical Exam Vitals signs reviewed.  Constitutional:      Appearance: Normal appearance. She is obese.  Cardiovascular:     Rate and Rhythm: Normal rate.  Pulmonary:     Effort: Pulmonary effort is normal.  Musculoskeletal: Normal range of motion.  Skin:    General: Skin  is warm and dry.  Neurological:     Mental Status: She is alert and oriented to person, place, and time.  Psychiatric:        Mood and Affect: Mood normal.        Behavior: Behavior normal.    RECENT LABS AND TESTS: BMET    Component Value Date/Time   NA 137 10/21/2017 0902   K 4.6 10/21/2017 0902   CL 102 10/21/2017 0902   CO2 20 10/21/2017 0902   GLUCOSE 97 10/21/2017 0902   BUN 51 (H) 10/21/2017 0902   CREATININE 1.33 (H) 10/21/2017 0902   CALCIUM 9.6 10/21/2017 0902   GFRNONAA 44 (L) 10/21/2017 0902   GFRAA 51 (L) 10/21/2017 0902   Lab Results  Component Value Date   HGBA1C 5.3 10/21/2017   HGBA1C 5.3 07/11/2017   HGBA1C 5.3 02/25/2017   Lab Results  Component Value Date   INSULIN 9.5 10/21/2017  INSULIN 8.5 07/11/2017   INSULIN 13.3 02/25/2017   CBC    Component Value Date/Time   WBC 9.5 02/25/2017 0938   RBC 4.16 02/25/2017 0938   HGB 13.0 02/25/2017 0938   HCT 38.3 02/25/2017 0938   MCV 92 02/25/2017 0938   MCH 31.3 02/25/2017 0938   MCHC 33.9 02/25/2017 0938   RDW 13.1 02/25/2017 0938   LYMPHSABS 2.1 02/25/2017 0938   EOSABS 0.2 02/25/2017 0938   BASOSABS 0.0 02/25/2017 0938   Iron/TIBC/Ferritin/ %Sat No results found for: IRON, TIBC, FERRITIN, IRONPCTSAT Lipid Panel     Component Value Date/Time   CHOL 168 07/11/2017 0819   TRIG 100 07/11/2017 0819   HDL 47 07/11/2017 0819   LDLCALC 101 (H) 07/11/2017 0819   Hepatic Function Panel     Component Value Date/Time   PROT 6.4 10/21/2017 0902   ALBUMIN 4.4 10/21/2017 0902   AST 12 10/21/2017 0902   ALT 19 10/21/2017 0902   ALKPHOS 57 10/21/2017 0902   BILITOT <0.2 10/21/2017 0902      Component Value Date/Time   TSH 3.380 02/25/2017 0938   Results for Tracy Beard, Tracy Beard (MRN [id number]) as of 02/04/2018 06:08  Ref. Range 10/21/2017 09:02  Vitamin D, 25-Hydroxy Latest Ref Range: 30.0 - 100.0 ng/mL 59.6     OBESITY BEHAVIORAL INTERVENTION VISIT  Today's visit was # 21   Starting  weight: 264 lbs Starting date: 02/25/17 Today's weight : Weight: 238 lb (108 kg)  Today's date: 02/03/2018 Total lbs lost to date: 50   ASK: We discussed the diagnosis of obesity with Tracy Beard today and Tracy Beard agreed to give [place] permission to discuss obesity behavioral modification therapy today.  ASSESS: Tracy Beard has the diagnosis of obesity and her BMI today is 43.5. Tracy Beard is in the action stage of change.   ADVISE: Tracy Beard was educated on the multiple health risks of obesity as well as the benefit of weight loss to improve her health. She was advised of the need for long term treatment and the importance of lifestyle modifications to improve her current health and to decrease her risk of future health problems.  AGREE: Multiple dietary modification options and treatment options were discussed and Tracy Beard agreed to follow the recommendations documented in the above note.  ARRANGE: Tracy Beard was educated on the importance of frequent visits to treat obesity as outlined per CMS and USPSTF guidelines and agreed to schedule her next follow up appointment today.  I, Kirke Corin, am acting as Energy manager for [employer], FNP-C.  I have reviewed the above documentation for accuracy and completeness, and I agree with the above.  -  , FNP-C.

## 2018-02-04 ENCOUNTER — Encounter (INDEPENDENT_AMBULATORY_CARE_PROVIDER_SITE_OTHER): Payer: Self-pay | Admitting: Family Medicine

## 2018-02-04 DIAGNOSIS — E559 Vitamin D deficiency, unspecified: Secondary | ICD-10-CM | POA: Insufficient documentation

## 2018-02-04 DIAGNOSIS — F32A Depression, unspecified: Secondary | ICD-10-CM | POA: Insufficient documentation

## 2018-02-04 DIAGNOSIS — F329 Major depressive disorder, single episode, unspecified: Secondary | ICD-10-CM | POA: Insufficient documentation

## 2018-02-24 ENCOUNTER — Encounter (INDEPENDENT_AMBULATORY_CARE_PROVIDER_SITE_OTHER): Payer: Self-pay | Admitting: Family Medicine

## 2018-02-24 ENCOUNTER — Ambulatory Visit (INDEPENDENT_AMBULATORY_CARE_PROVIDER_SITE_OTHER): Payer: BC Managed Care – PPO | Admitting: Family Medicine

## 2018-02-24 VITALS — BP 102/72 | HR 77 | Temp 97.8°F | Ht 62.0 in | Wt 244.0 lb

## 2018-02-24 DIAGNOSIS — E559 Vitamin D deficiency, unspecified: Secondary | ICD-10-CM | POA: Diagnosis not present

## 2018-02-24 DIAGNOSIS — F3289 Other specified depressive episodes: Secondary | ICD-10-CM

## 2018-02-24 DIAGNOSIS — Z9189 Other specified personal risk factors, not elsewhere classified: Secondary | ICD-10-CM | POA: Diagnosis not present

## 2018-02-24 DIAGNOSIS — Z6841 Body Mass Index (BMI) 40.0 and over, adult: Secondary | ICD-10-CM

## 2018-02-24 MED ORDER — BUPROPION HCL ER (SR) 200 MG PO TB12: 200.0000 mg | ORAL_TABLET | Freq: Two times a day (BID) | ORAL | 0 refills | Status: DC
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
## 2018-03-17 ENCOUNTER — Encounter (INDEPENDENT_AMBULATORY_CARE_PROVIDER_SITE_OTHER): Payer: Self-pay | Admitting: Family Medicine

## 2018-03-17 ENCOUNTER — Ambulatory Visit (INDEPENDENT_AMBULATORY_CARE_PROVIDER_SITE_OTHER): Payer: BC Managed Care – PPO | Admitting: Family Medicine

## 2018-03-17 VITALS — BP 106/73 | HR 72 | Temp 97.5°F | Ht 62.0 in | Wt 242.0 lb

## 2018-03-17 DIAGNOSIS — Z6841 Body Mass Index (BMI) 40.0 and over, adult: Secondary | ICD-10-CM | POA: Diagnosis not present

## 2018-03-17 DIAGNOSIS — E8881 Metabolic syndrome: Secondary | ICD-10-CM | POA: Diagnosis not present

## 2018-03-17 NOTE — Progress Notes (Signed)
Office: [phone]  /  Fax: [phone]   HPI:   Chief Complaint: OBESITY Tracy Beard is here to discuss her progress with her obesity treatment plan. She is on the Category 3 plan and is following her eating plan approximately 50 % of the time. She states she is exercising 0 minutes 0 times per week. Tracy Beard has been off the plan recently. She plans to get back on the plan on 03/26/18, which is her 1st day off of retirement. She says that she tends to have an "all or nothing" mentality and feels bupropion helps quite a bit with stress eating.  Her weight is 242 lb (109.8 kg) today and has had a weight loss of 2 pounds over a period of 3 weeks since her last visit. She has lost 22 lbs since starting treatment with us.  Insulin Resistance Tracy Beard has a diagnosis of insulin resistance based on her elevated fasting insulin level >5. Although [name]'s blood glucose readings are still under good control, insulin resistance puts her at greater risk of metabolic syndrome and diabetes. Her last A1c was 5.3 on 10/21/17. She is not taking metformin currently and denies polyphagia. Tracy Beard continues to work on diet and exercise to decrease risk of diabetes.  ASSESSMENT AND PLAN:  Insulin resistance  Class 3 severe obesity with serious comorbidity and body mass index (BMI) of 40.0 to 44.9 in adult, unspecified obesity type (HCC)  PLAN:  Insulin Resistance Tracy Beard will continue to work on weight loss, exercise, and decreasing simple carbohydrates in her diet to help decrease the risk of diabetes. She was informed that eating too many simple carbohydrates or too many calories at one sitting increases the likelihood of GI side effects. We will check her A1c, fasting Insulin, and fasting glucose at her next visit. Tracy Beard agreed to continue her meal plan and to follow up with us as directed to monitor her progress in 3 weeks.  I spent > than 50% of the 15 minute visit on counseling as documented in  the note.  Obesity Tracy Beard is currently in the action stage of change. As such, her goal is to continue with weight loss efforts. She has agreed to follow the Category 3 plan. Tracy Beard has been instructed to start exercising again on 03/26/18. We discussed the following Behavioral Modification Strategies today: planning for success.  Tracy Beard has agreed to follow up with our clinic in 3 weeks. She was informed of the importance of frequent follow up visits to maximize her success with intensive lifestyle modifications for her multiple health conditions.  ALLERGIES: No Known Allergies  MEDICATIONS: Current Outpatient Medications on File Prior to Visit  Medication Sig Dispense Refill  . buPROPion (WELLBUTRIN SR) 200 MG 12 hr tablet Take 1 tablet (200 mg total) by mouth 2 (two) times daily. 60 tablet 0  . Calcium Carbonate-Vitamin D (CALCIUM 600+D PO) Take 2 tablets by mouth daily.    . Cholecalciferol (VITAMIN D3) 2000 units TABS Take 1 capsule by mouth daily.    . diphenhydrAMINE (SOMINEX) 25 MG tablet Take 25 mg by mouth at bedtime as needed for sleep.    Marland Kitchen. esomeprazole (NEXIUM) 40 MG capsule Take 40 mg by mouth daily at 12 noon.    Marland Kitchen. lisinopril-hydrochlorothiazide (PRINZIDE,ZESTORETIC) 20-25 MG tablet Take 1 tablet by mouth daily.    . magnesium gluconate (MAGONATE) 500 MG tablet Take 500 mg by mouth daily.    . Multiple Vitamins-Minerals (MULTIVITAMIN ADULT PO) Take 1 tablet by mouth daily.  No current facility-administered medications on file prior to visit.     PAST MEDICAL HISTORY: Past Medical History:  Diagnosis Date  . Acid reflux   . HTN (hypertension)     PAST SURGICAL HISTORY: History reviewed. No pertinent surgical history.  SOCIAL HISTORY: Social History   Tobacco Use  . Smoking status: Former Smoker    Packs/day: 1.00    Years: 24.00    Pack years: 24.00    Last attempt to quit: 1997    Years since quitting: 23.1  . Smokeless tobacco: Never Used    Substance Use Topics  . Alcohol use: No    Frequency: Never  . Drug use: No    FAMILY HISTORY: Family History  Problem Relation Age of Onset  . Hypertension Mother   . Hypertension Father     ROS: Review of Systems  Constitutional: Positive for weight loss.  Endo/Heme/Allergies:       Negative for polyphagia.    PHYSICAL EXAM: Blood pressure 106/73, pulse 72, temperature (!) 97.5 F (36.4 C), temperature source Oral, height 5\' 2"  (1.575 m), weight 242 lb (109.8 kg), SpO2 97 %. Body mass index is 44.26 kg/m. Physical Exam Vitals signs reviewed.  Constitutional:      Appearance: Normal appearance. She is obese.  Cardiovascular:     Rate and Rhythm: Normal rate.  Pulmonary:     Effort: Pulmonary effort is normal.  Musculoskeletal: Normal range of motion.  Skin:    General: Skin is warm and dry.  Neurological:     Mental Status: She is alert and oriented to person, place, and time.  Psychiatric:        Mood and Affect: Mood normal.        Behavior: Behavior normal.     RECENT LABS AND TESTS: BMET    Component Value Date/Time   NA 137 10/21/2017 0902   K 4.6 10/21/2017 0902   CL 102 10/21/2017 0902   CO2 20 10/21/2017 0902   GLUCOSE 97 10/21/2017 0902   BUN 51 (H) 10/21/2017 0902   CREATININE 1.33 (H) 10/21/2017 0902   CALCIUM 9.6 10/21/2017 0902   GFRNONAA 44 (L) 10/21/2017 0902   GFRAA 51 (L) 10/21/2017 0902   Lab Results  Component Value Date   HGBA1C 5.3 10/21/2017   HGBA1C 5.3 07/11/2017   HGBA1C 5.3 02/25/2017   Lab Results  Component Value Date   INSULIN 9.5 10/21/2017   INSULIN 8.5 07/11/2017   INSULIN 13.3 02/25/2017   CBC    Component Value Date/Time   WBC 9.5 02/25/2017 0938   RBC 4.16 02/25/2017 0938   HGB 13.0 02/25/2017 0938   HCT 38.3 02/25/2017 0938   MCV 92 02/25/2017 0938   MCH 31.3 02/25/2017 0938   MCHC 33.9 02/25/2017 0938   RDW 13.1 02/25/2017 0938   LYMPHSABS 2.1 02/25/2017 0938   EOSABS 0.2 02/25/2017 0938    BASOSABS 0.0 02/25/2017 0938   Iron/TIBC/Ferritin/ %Sat No results found for: IRON, TIBC, FERRITIN, IRONPCTSAT Lipid Panel     Component Value Date/Time   CHOL 168 07/11/2017 0819   TRIG 100 07/11/2017 0819   HDL 47 07/11/2017 0819   LDLCALC 101 (H) 07/11/2017 0819   Hepatic Function Panel     Component Value Date/Time   PROT 6.4 10/21/2017 0902   ALBUMIN 4.4 10/21/2017 0902   AST 12 10/21/2017 0902   ALT 19 10/21/2017 0902   ALKPHOS 57 10/21/2017 0902   BILITOT <0.2 10/21/2017 0902  Component Value Date/Time   TSH 3.380 02/25/2017 0938   Results for MAZEY, BALTON (MRN [id number]) as of 03/17/2018 16:03  Ref. Range 10/21/2017 09:02  Vitamin D, 25-Hydroxy Latest Ref Range: 30.0 - 100.0 ng/mL 59.6    OBESITY BEHAVIORAL INTERVENTION VISIT  Today's visit was # 3   Starting weight: 264 lbs Starting date: 02/25/17 Today's weight : Weight: 242 lb (109.8 kg)  Today's date: 03/17/2018 Total lbs lost to date: 22  ASK: We discussed the diagnosis of obesity with [name] today and Tracy Johns agreed to give [place] permission to discuss obesity behavioral modification therapy today.  ASSESS: Tracy Johns has the diagnosis of obesity and her BMI today is 44.2. Tracy Johns is in the action stage of change.   ADVISE: Tracy Johns was educated on the multiple health risks of obesity as well as the benefit of weight loss to improve her health. She was advised of the need for long term treatment and the importance of lifestyle modifications to improve her current health and to decrease her risk of future health problems.  AGREE: Multiple dietary modification options and treatment options were discussed and Tracy Johns agreed to follow the recommendations documented in the above note.  ARRANGE: Tracy Johns was educated on the importance of frequent visits to treat obesity as outlined per CMS and USPSTF guidelines and agreed to schedule her next follow up appointment today.  I, Kirke Corin, am acting as Energy manager for [place], FNP-C.  I have reviewed the above documentation for accuracy and completeness, and I agree with the above.  -  , FNP-C.

## 2018-04-07 ENCOUNTER — Encounter (INDEPENDENT_AMBULATORY_CARE_PROVIDER_SITE_OTHER): Payer: Self-pay | Admitting: Family Medicine

## 2018-04-07 ENCOUNTER — Ambulatory Visit (INDEPENDENT_AMBULATORY_CARE_PROVIDER_SITE_OTHER): Payer: BC Managed Care – PPO | Admitting: Family Medicine

## 2018-04-07 VITALS — BP 93/67 | HR 83 | Temp 97.9°F | Ht 62.0 in | Wt 237.0 lb

## 2018-04-07 DIAGNOSIS — Z6841 Body Mass Index (BMI) 40.0 and over, adult: Secondary | ICD-10-CM

## 2018-04-07 DIAGNOSIS — Z9189 Other specified personal risk factors, not elsewhere classified: Secondary | ICD-10-CM

## 2018-04-07 DIAGNOSIS — E8881 Metabolic syndrome: Secondary | ICD-10-CM | POA: Diagnosis not present

## 2018-04-07 DIAGNOSIS — E559 Vitamin D deficiency, unspecified: Secondary | ICD-10-CM | POA: Diagnosis not present

## 2018-04-07 NOTE — Progress Notes (Signed)
Office: [phone]  /  Fax: [phone]   HPI:   Chief Complaint: OBESITY Tracy Beard is here to discuss her progress with her obesity treatment plan. She is on the Category 3 plan and is following her eating plan approximately 80% of the time. She states she is doing aerobics and using weights 20-30 minutes 3-5 times per week. Tracy Beard states she has increased her workouts. She states sticking to the plan is easier since her retirement two weeks ago  Her weight is 237 lb (107.5 kg) today and has had a weight loss of 5 pounds over a period of 3 weeks since her last visit. She has lost 27 lbs since starting treatment with [place].  Vitamin D deficiency Tracy Beard has a diagnosis of Vitamin D deficiency and is at goal. Her last Vitamin D level was reported to be 59.6 on 10/21/2017. She is currently taking OTC Vit D 2,000 daily and denies fatigue.  Insulin Resistance Tracy Beard has a diagnosis of insulin resistance based on her elevated fasting insulin level >5. Although [name]'s blood glucose readings are still under good control, insulin resistance puts her at greater risk of metabolic syndrome and diabetes. She is not taking metformin currently and continues to work on diet and exercise to decrease risk of diabetes. She denies polyphagia. Lab Results  Component Value Date   HGBA1C 5.3 10/21/2017    At risk for diabetes Tracy Beard is at higher than averagerisk for developing diabetes due to her obesity. She currently denies polyuria or polydipsia.  ASSESSMENT AND PLAN:  Vitamin D deficiency - Plan: VITAMIN D 25 Hydroxy (Vit-D Deficiency, Fractures)  Insulin resistance - Plan: Comprehensive metabolic panel, Hemoglobin A1c, Insulin, random, T3, T4, free, TSH  At risk for diabetes mellitus  Class 3 severe obesity with serious comorbidity and body mass index (BMI) of 40.0 to 44.9 in adult, unspecified obesity type (HCC)  PLAN:  Vitamin D Deficiency Tracy Beard was informed that low Vitamin D  levels contributes to fatigue and are associated with obesity, breast, and colon cancer. She agrees to continue to take OTC Vit D @ 2,000 IU every week and will have routine testing of Vitamin D today. She was informed of the risk of over-replacement of Vitamin D and agrees to not increase her dose unless she discusses this with [place] first. Tracy Beard agrees to follow-up with our clinic in 2-3 weeks.  Insulin Resistance Tracy Beard will continue to work on weight loss, exercise, and decreasing simple carbohydrates in her diet to help decrease the risk of diabetes. We dicussed metformin including benefits and risks. She was informed that eating too many simple carbohydrates or too many calories at one sitting increases the likelihood of GI side effects. Tracy Beard will have A1c, fasting glucose and insulin checked today. She agrees to follow-up with [place] as directed to monitor her progress.  Diabetes risk counseling Tracy Beard was given extended (15 minutes) diabetes prevention counseling today. She is 58 y.o. female and has risk factors for diabetes including obesity. We discussed intensive lifestyle modifications today with an emphasis on weight loss as well as increasing exercise and decreasing simple carbohydrates in her diet.  Obesity Tracy Beard is currently in the action stage of change. As such, her goal is to continue with weight loss efforts. She has agreed to follow the Category 3 plan. Tracy Beard has been instructed to continue her exercise regimen as noted above. We discussed the following Behavioral Modification Strategies today: planning for success.  Tracy Beard has agreed to follow-up with our clinic in 2-3  weeks. She was informed of the importance of frequent follow up visits to maximize her success with intensive lifestyle modifications for her multiple health conditions.  ALLERGIES: No Known Allergies  MEDICATIONS: Current Outpatient Medications on File Prior to Visit  Medication Sig Dispense  Refill  . buPROPion (WELLBUTRIN SR) 200 MG 12 hr tablet Take 1 tablet (200 mg total) by mouth 2 (two) times daily. 60 tablet 0  . Calcium Carbonate-Vitamin D (CALCIUM 600+D PO) Take 2 tablets by mouth daily.    . Cholecalciferol (VITAMIN D3) 2000 units TABS Take 1 capsule by mouth daily.    . diphenhydrAMINE (SOMINEX) 25 MG tablet Take 25 mg by mouth at bedtime as needed for sleep.    Marland Kitchen esomeprazole (NEXIUM) 40 MG capsule Take 40 mg by mouth daily at 12 noon.    Marland Kitchen lisinopril-hydrochlorothiazide (PRINZIDE,ZESTORETIC) 20-25 MG tablet Take 1 tablet by mouth daily.    . magnesium gluconate (MAGONATE) 500 MG tablet Take 500 mg by mouth daily.    . Multiple Vitamins-Minerals (MULTIVITAMIN ADULT PO) Take 1 tablet by mouth daily.     No current facility-administered medications on file prior to visit.     PAST MEDICAL HISTORY: Past Medical History:  Diagnosis Date  . Acid reflux   . HTN (hypertension)     PAST SURGICAL HISTORY: History reviewed. No pertinent surgical history.  SOCIAL HISTORY: Social History   Tobacco Use  . Smoking status: Former Smoker    Packs/day: 1.00    Years: 24.00    Pack years: 24.00    Last attempt to quit: 1997    Years since quitting: 23.2  . Smokeless tobacco: Never Used  Substance Use Topics  . Alcohol use: No    Frequency: Never  . Drug use: No    FAMILY HISTORY: Family History  Problem Relation Age of Onset  . Hypertension Mother   . Hypertension Father    ROS: Review of Systems  Constitutional: Positive for weight loss. Negative for malaise/fatigue.  Endo/Heme/Allergies:       Negative for polyphagia. Negative for hypoglycemia.   PHYSICAL EXAM: Blood pressure 93/67, pulse 83, temperature 97.9 F (36.6 C), height 5\' 2"  (1.575 m), weight 237 lb (107.5 kg), SpO2 96 %. Body mass index is 43.35 kg/m. Physical Exam Vitals signs reviewed.  Constitutional:      Appearance: Normal appearance. She is obese.  Cardiovascular:     Rate and  Rhythm: Normal rate.     Pulses: Normal pulses.  Pulmonary:     Effort: Pulmonary effort is normal.     Breath sounds: Normal breath sounds.  Musculoskeletal: Normal range of motion.  Skin:    General: Skin is warm and dry.  Neurological:     Mental Status: She is alert and oriented to person, place, and time.  Psychiatric:        Behavior: Behavior normal.   RECENT LABS AND TESTS: BMET    Component Value Date/Time   NA 137 10/21/2017 0902   K 4.6 10/21/2017 0902   CL 102 10/21/2017 0902   CO2 20 10/21/2017 0902   GLUCOSE 97 10/21/2017 0902   BUN 51 (H) 10/21/2017 0902   CREATININE 1.33 (H) 10/21/2017 0902   CALCIUM 9.6 10/21/2017 0902   GFRNONAA 44 (L) 10/21/2017 0902   GFRAA 51 (L) 10/21/2017 0902   Lab Results  Component Value Date   HGBA1C 5.3 10/21/2017   HGBA1C 5.3 07/11/2017   HGBA1C 5.3 02/25/2017   Lab Results  Component Value  Date   INSULIN 9.5 10/21/2017   INSULIN 8.5 07/11/2017   INSULIN 13.3 02/25/2017   CBC    Component Value Date/Time   WBC 9.5 02/25/2017 0938   RBC 4.16 02/25/2017 0938   HGB 13.0 02/25/2017 0938   HCT 38.3 02/25/2017 0938   MCV 92 02/25/2017 0938   MCH 31.3 02/25/2017 0938   MCHC 33.9 02/25/2017 0938   RDW 13.1 02/25/2017 0938   LYMPHSABS 2.1 02/25/2017 0938   EOSABS 0.2 02/25/2017 0938   BASOSABS 0.0 02/25/2017 0938   Iron/TIBC/Ferritin/ %Sat No results found for: IRON, TIBC, FERRITIN, IRONPCTSAT Lipid Panel     Component Value Date/Time   CHOL 168 07/11/2017 0819   TRIG 100 07/11/2017 0819   HDL 47 07/11/2017 0819   LDLCALC 101 (H) 07/11/2017 0819   Hepatic Function Panel     Component Value Date/Time   PROT 6.4 10/21/2017 0902   ALBUMIN 4.4 10/21/2017 0902   AST 12 10/21/2017 0902   ALT 19 10/21/2017 0902   ALKPHOS 57 10/21/2017 0902   BILITOT <0.2 10/21/2017 0902      Component Value Date/Time   TSH 3.380 02/25/2017 0938    Ref. Range 10/21/2017 09:02  Vitamin D, 25-Hydroxy Latest Ref Range: 30.0 -  100.0 ng/mL 59.6   OBESITY BEHAVIORAL INTERVENTION VISIT  Today's visit was #24  Starting weight: 264 lbs Starting date: 02/25/2017 Today's weight: 237 lbs Today's date: 04/07/2018 Total lbs lost to date: 27    04/07/2018  Height  (1.575 m)  Weight 237 lb (107.5 kg)  BMI (Calculated) 43.34  BLOOD PRESSURE - SYSTOLIC 93  BLOOD PRESSURE - DIASTOLIC 67   Body Fat % 51.2 %  Total Body Water (lbs) 85 lbs   ASK: We discussed the diagnosis of obesity with [name] today and Tracy Beard agreed to give [place] permission to discuss obesity behavioral modification therapy today.  ASSESS: Tracy Beard has the diagnosis of obesity and her BMI today is 43.34. Tracy Beard is in the action stage of change.   ADVISE: Tracy Beard was educated on the multiple health risks of obesity as well as the benefit of weight loss to improve her health. She was advised of the need for long term treatment and the importance of lifestyle modifications to improve her current health and to decrease her risk of future health problems.  AGREE: Multiple dietary modification options and treatment options were discussed and  Tracy Beard agreed to follow the recommendations documented in the above note.  ARRANGE: Tracy Beard was educated on the importance of frequent visits to treat obesity as outlined per CMS and USPSTF guidelines and agreed to schedule her next follow up appointment today.  IMarianna Payment, am acting as Energy manager for [place], FNP-C.  I have reviewed the above documentation for accuracy and completeness, and I agree with the above.  - Dawn Whitmire, FNP-C.

## 2018-04-08 ENCOUNTER — Encounter (INDEPENDENT_AMBULATORY_CARE_PROVIDER_SITE_OTHER): Payer: Self-pay | Admitting: Family Medicine

## 2018-04-08 LAB — T4, FREE: Free T4: 1.18 ng/dL (ref 0.82–1.77)

## 2018-04-08 LAB — COMPREHENSIVE METABOLIC PANEL
ALT: 14 IU/L (ref 0–32)
AST: 11 IU/L (ref 0–40)
Albumin/Globulin Ratio: 2 (ref 1.2–2.2)
Albumin: 4.6 g/dL (ref 3.8–4.9)
Alkaline Phosphatase: 66 IU/L (ref 39–117)
BUN/Creatinine Ratio: 27 — ABNORMAL HIGH (ref 9–23)
BUN: 36 mg/dL — ABNORMAL HIGH (ref 6–24)
Bilirubin Total: 0.3 mg/dL (ref 0.0–1.2)
CO2: 20 mmol/L (ref 20–29)
Calcium: 10 mg/dL (ref 8.7–10.2)
Chloride: 99 mmol/L (ref 96–106)
Creatinine, Ser: 1.33 mg/dL — ABNORMAL HIGH (ref 0.57–1.00)
GFR calc Af Amer: 50 mL/min/{1.73_m2} — ABNORMAL LOW (ref >59)
GFR calc non Af Amer: 44 mL/min/{1.73_m2} — ABNORMAL LOW (ref >59)
Globulin, Total: 2.3 g/dL (ref 1.5–4.5)
Glucose: 92 mg/dL (ref 65–99)
Potassium: 4.8 mmol/L (ref 3.5–5.2)
Sodium: 138 mmol/L (ref 134–144)
Total Protein: 6.9 g/dL (ref 6.0–8.5)

## 2018-04-08 LAB — INSULIN, RANDOM: INSULIN: 7.3 u[IU]/mL (ref 2.6–24.9)

## 2018-04-08 LAB — HEMOGLOBIN A1C
Est. average glucose Bld gHb Est-mCnc: 105 mg/dL
Hgb A1c MFr Bld: 5.3 % (ref 4.8–5.6)

## 2018-04-08 LAB — T3: T3, Total: 100 ng/dL (ref 71–180)

## 2018-04-08 LAB — VITAMIN D 25 HYDROXY (VIT D DEFICIENCY, FRACTURES): Vit D, 25-Hydroxy: 62.8 ng/mL (ref 30.0–100.0)

## 2018-04-08 LAB — TSH: TSH: 3.81 u[IU]/mL (ref 0.450–4.500)

## 2018-04-11 ENCOUNTER — Encounter (INDEPENDENT_AMBULATORY_CARE_PROVIDER_SITE_OTHER): Payer: Self-pay | Admitting: Family Medicine

## 2018-04-14 ENCOUNTER — Other Ambulatory Visit (INDEPENDENT_AMBULATORY_CARE_PROVIDER_SITE_OTHER): Payer: Self-pay

## 2018-04-14 DIAGNOSIS — F3289 Other specified depressive episodes: Secondary | ICD-10-CM

## 2018-04-14 MED ORDER — BUPROPION HCL ER (SR) 200 MG PO TB12: 200.0000 mg | ORAL_TABLET | Freq: Two times a day (BID) | ORAL | 0 refills | Status: DC

## 2018-04-22 ENCOUNTER — Encounter (INDEPENDENT_AMBULATORY_CARE_PROVIDER_SITE_OTHER): Payer: Self-pay

## 2018-04-22 ENCOUNTER — Encounter (INDEPENDENT_AMBULATORY_CARE_PROVIDER_SITE_OTHER): Payer: Self-pay | Admitting: Family Medicine

## 2018-04-28 ENCOUNTER — Ambulatory Visit (INDEPENDENT_AMBULATORY_CARE_PROVIDER_SITE_OTHER): Payer: BC Managed Care – PPO | Admitting: Family Medicine

## 2018-05-01 ENCOUNTER — Encounter (INDEPENDENT_AMBULATORY_CARE_PROVIDER_SITE_OTHER): Payer: Self-pay | Admitting: Family Medicine

## 2019-03-28 ENCOUNTER — Ambulatory Visit: Payer: BC Managed Care – PPO | Attending: Internal Medicine

## 2019-03-28 DIAGNOSIS — Z23 Encounter for immunization: Secondary | ICD-10-CM | POA: Insufficient documentation

## 2019-03-28 NOTE — Progress Notes (Signed)
   Covid-19 Vaccination Clinic  Name:  Dail Meece    MRN: [id number] DOB: May 25, 1960  03/28/2019  Ms. Boesch was observed post Covid-19 immunization for 15 minutes without incidence. She was provided with Vaccine Information Sheet and instruction to access the V-Safe system.   Ms. Gorelick was instructed to call 911 with any severe reactions post vaccine: Marland Kitchen Difficulty breathing  . Swelling of your face and throat  . A fast heartbeat  . A bad rash all over your body  . Dizziness and weakness    Immunizations Administered    Name Date Dose VIS Date Route   Pfizer COVID-19 Vaccine 03/28/2019  3:40 PM 0.3 mL 01/09/2019 Intramuscular   Manufacturer: [employer], [employer]   Lot: [id number]   NDC: [address]-3

## 2019-04-18 ENCOUNTER — Ambulatory Visit: Payer: BC Managed Care – PPO | Attending: Internal Medicine

## 2019-04-18 DIAGNOSIS — Z23 Encounter for immunization: Secondary | ICD-10-CM

## 2019-04-18 NOTE — Progress Notes (Signed)
   Covid-19 Vaccination Clinic  Name:  Harrison Paulson    MRN: [id number] DOB: Oct 09, 1960  04/18/2019  Ms. Camerer was observed post Covid-19 immunization for 15 minutes without incident. She was provided with Vaccine Information Sheet and instruction to access the V-Safe system.   Ms. Orzel was instructed to call 911 with any severe reactions post vaccine: Marland Kitchen Difficulty breathing  . Swelling of face and throat  . A fast heartbeat  . A bad rash all over body  . Dizziness and weakness   Immunizations Administered    Name Date Dose VIS Date Route   Pfizer COVID-19 Vaccine 04/18/2019  8:38 AM 0.3 mL 01/09/2019 Intramuscular   Manufacturer: [employer], [employer]   Lot: [id number]   NDC: [address]-2

## 2019-05-14 ENCOUNTER — Other Ambulatory Visit: Payer: Self-pay

## 2019-05-14 ENCOUNTER — Ambulatory Visit: Payer: BC Managed Care – PPO | Admitting: Family Medicine

## 2019-05-14 ENCOUNTER — Encounter: Payer: Self-pay | Admitting: Family Medicine

## 2019-05-14 DIAGNOSIS — M5136 Other intervertebral disc degeneration, lumbar region: Secondary | ICD-10-CM | POA: Diagnosis not present

## 2019-05-14 NOTE — Progress Notes (Signed)
Office Visit Note   Patient: Tracy Beard           Date of Birth: [birth year]           MRN: [id number] Visit Date: 05/14/2019 Requested by: Marda Stalker, PA-C [place],  [place] [address] PCP: Marda Stalker, PA-C  Subjective: Chief Complaint  Patient presents with  . Lower Back - Pain    Chronic pain, usually just to the left of the center of her lower back. Has been working in the yard a lot lately. Had an incidence of pain/tingling rt lower leg - got better. Also, had pain up the left side of back x 3 wks - got better.    HPI: She is here with left-sided low back pain.  Longstanding intermittent problems with her low back.  A few weeks ago after doing a lot of yard work, she started having increasing pain in a different area than usual, along the paraspinous muscles of the left side of her low back.  She was also having some tingling in her right lower leg.  She went to her PCP who gave her prednisone which helped significantly with those pains.  She already had this appointment so she comes in for evaluation of her chronic pain.  Denies bowel or bladder dysfunction.  Her pain does not require medication, it goes away when she rests or sleeps at night.  No unintentional weight change.  She has been obese for a long time but this past year she has lost quite a bit of weight.  She is a former smoker who quit more than 20 years ago.  She has not had any major car accidents or injuries.  2 years ago during work-up for abdominal pain, she had a CT scan which showed significant degenerative changes at the L4-5 level.  I reviewed those images on computer today.  She does not take NSAIDs due to abnormal creatinine and GFR.              ROS: No history of diabetes.  All other systems were reviewed and are negative.  Objective: Vital Signs: There were no vitals taken for this visit.  Physical Exam:  General:  Alert and oriented, in no acute  distress. Pulm:  Breathing unlabored. Psy:  Normal mood, congruent affect. Skin: No rash. Low back: No significant tenderness over the lumbar spinous processes.  She is slightly tender to the left of midline at the L5-S1 level and near the SI joint.  No pain in the sciatic notch, no pain over the greater trochanter.  Negative straight leg raise, lower extremity strength and reflexes are equal, she has hypoactive DTRs throughout.  Imaging: None today  Assessment & Plan: 1.  Chronic low back pain with degenerative disc disease at L4-5 and probably facet arthropathy as well. -We will refer her to physical therapy for treatment as well as teaching of home exercise program for maintenance. -She will continue her efforts at weight loss. -If symptoms worsen, she will contact me and we will order MRI scan to further evaluate. -No new medications given.     Procedures: No procedures performed  No notes on file     PMFS History: Patient Active Problem List   Diagnosis Date Noted  . Vitamin D deficiency 02/04/2018  . Depression 02/04/2018  . Essential hypertension 11/20/2017  . Class 3 severe obesity with serious comorbidity and body mass index (BMI) of 40.0 to 44.9 in adult ([place]) 11/20/2017  .  Serum potassium elevated 03/11/2017  . Chronic kidney disease 03/11/2017  . Insulin resistance 03/11/2017  . Other fatigue 02/25/2017  . Shortness of breath on exertion 02/25/2017  . Prediabetes 02/25/2017   Past Medical History:  Diagnosis Date  . Acid reflux   . HTN (hypertension)     Family History  Problem Relation Age of Onset  . Hypertension Mother   . Hypertension Father     History reviewed. No pertinent surgical history. Social History   Occupational History  . Occupation: Principal  Tobacco Use  . Smoking status: Former Smoker    Packs/day: 1.00    Years: 24.00    Pack years: 24.00    Quit date: 1997    Years since quitting: 24.3  . Smokeless tobacco: Never Used    Substance and Sexual Activity  . Alcohol use: No  . Drug use: No  . Sexual activity: Yes    Partners: Female    Birth control/protection: None

## 2019-05-19 ENCOUNTER — Other Ambulatory Visit: Payer: Self-pay

## 2019-05-19 ENCOUNTER — Encounter: Payer: Self-pay | Admitting: Physical Therapy

## 2019-05-19 ENCOUNTER — Ambulatory Visit: Payer: BC Managed Care – PPO | Attending: Family Medicine | Admitting: Physical Therapy

## 2019-05-19 DIAGNOSIS — M545 Low back pain, unspecified: Secondary | ICD-10-CM

## 2019-05-19 DIAGNOSIS — G8929 Other chronic pain: Secondary | ICD-10-CM | POA: Insufficient documentation

## 2019-05-19 NOTE — Therapy (Signed)
[facility] [address] [place], [place], [address] Phone: [phone]   Fax:  [phone]  Physical Therapy Evaluation  Patient Details  Name: Genee Rann MRN: [id number] Date of Birth: 23-Jun-1960 Referring Provider (PT): Eunice Blase MD   Encounter Date: 05/19/2019  PT End of Session - 05/19/19 1442    Visit Number  1    Date for PT Re-Evaluation  07/14/19    Authorization Type  BCBS    PT Start Time  0737    PT Stop Time  1530    PT Time Calculation (min)  45 min    Activity Tolerance  Patient tolerated treatment well    Behavior During Therapy  [facility] for tasks assessed/performed       Past Medical History:  Diagnosis Date  . Acid reflux   . HTN (hypertension)     History reviewed. No pertinent surgical history.  There were no vitals filed for this visit.   Subjective Assessment - 05/19/19 1444    Subjective  About 4 weeks ago, she had increased tightness and pain in the left back upper lumbar. She was given a steroid injection which resolved. She has chronic low back pain which is affecting her walking and ADLs.    Pertinent History  HTN, obesity, CKD    Currently in Pain?  No/denies    Pain Score  4    with remodeling/lifting pavers etc gets to 9/10   Pain Location  Back    Pain Orientation  Left    Pain Descriptors / Indicators  Aching    Pain Type  Chronic pain    Pain Onset  More than a month ago    Pain Frequency  Intermittent    Aggravating Factors   gardening/landscaping    Pain Relieving Factors  heat, rest    Effect of Pain on Daily Activities  more rests required, limits ADLS         [facility] PT Assessment - 05/19/19 0001      Assessment   Medical Diagnosis  lumbar DDD    Referring Provider (PT)  Eunice Blase MD    Onset Date/Surgical Date  04/14/19    Next MD Visit  none    Prior Therapy  no      Precautions   Precautions  None      Restrictions   Weight  Bearing Restrictions  No      Balance Screen   Has the patient fallen in the past 6 months  No    Has the patient had a decrease in activity level because of a fear of falling?   No    Is the patient reluctant to leave their home because of a fear of falling?   No      Home Film/video editor residence      Prior Function   Level of [employer]  Retired    Designer, jewellery, Sports administrator / Strength   AROM / PROM / Strength  AROM;Strength      Strength   Overall Strength Comments  Bil hip flex 5/5, ABD 4/5, ext 4+/5; Bil knees 5/5; Weak core with MMT; good base transverse abominus but weak with progressive TE      Flexibility   Soft Tissue Assessment /Muscle Length  yes    Hamstrings  WNL    Quadriceps  mild tightness  bil    Piriformis  bil tightness right > Left    Quadratus Lumborum  left QL; right hip ADDuctors      Palpation   Spinal mobility  Pain bil with PA mobs L1- L5/S1    Palpation comment  bil glut med; right glut max and piriformis; left QL                Objective measurements completed on examination: See above findings.              PT Education - 05/19/19 1540    Education Details  HEP; DN ed    Person(s) Educated  Patient    Methods  Explanation;Demonstration;Handout    Comprehension  Verbalized understanding;Returned demonstration       PT Short Term Goals - 05/19/19 1551      PT SHORT TERM GOAL #1   Title  Ind with initial HEP    Time  3    Period  Weeks    Status  New    Target Date  06/09/19      PT SHORT TERM GOAL #2   Title  Independent in safe exercises to do and modifications to current gym program to protect low back.    Time  4    Period  Weeks    Status  New    Target Date  06/16/19        PT Long Term Goals - 05/19/19 1551      PT LONG TERM GOAL #1   Title  Ind with advanced HEP for strengthening and flexibility to protect her back from injury.     Time  8    Period  Weeks    Status  New    Target Date  07/14/19      PT LONG TERM GOAL #2   Title  Patient to report decreased incidence of pain by 50% or more with gardening and landscaping activities.    Time  8    Period  Weeks    Status  New      PT LONG TERM GOAL #3   Title  Patient to report decreased pain with ADLS by 80% in the low back.    Time  8    Period  Weeks    Status  New      PT LONG TERM GOAL #4   Title  Improved BLE strength to 5/5 and good stabilization of core with MMT    Time  8    Period  Weeks    Status  New      PT LONG TERM GOAL #5   Title  Improvd FOTO score to <= 29% limitation    Baseline  40% limitation    Time  8    Period  Weeks    Status  New             Plan - 05/19/19 1541    Clinical Impression Statement  Patient presents with reports of increased low back pain starting one month ago. She has chronic left lumbar pain due to DDD, but experienced increased spasms higher up which resolved with prednisone. Her pain is now intermittent and aggravated with increased activity including gardening and landscaping. She has full lumbar ROM, but pain with PA mobs bil left>Right. She has tightness bil in her back and hips and weakness in BLE as well as transverse abdominus. She will benefit from PT to strengthen her low back, BLE and  abdominals to reduce incidence of pain and prevent further injury.    Personal Factors and Comorbidities  Comorbidity 1    Comorbidities  obesity    Stability/Clinical Decision Making  Stable/Uncomplicated    Clinical Decision Making  Low    Rehab Potential  Excellent    PT Frequency  2x / week    PT Duration  8 weeks    PT Treatment/Interventions  ADLs/Self Care Home Management;Cryotherapy;Electrical Stimulation;Moist Heat;Traction;Neuromuscular re-education;Therapeutic exercise;Therapeutic activities;Patient/family education;Manual techniques;Dry needling;Taping;Spinal Manipulations;Joint Manipulations    PT  Next Visit Plan  Review HEP, review proper lifting technique, standing/functional low back strengthening (dead lifts, squats, good mornings); transverse abdominus; Hip strength; manual/ DN to lumbar multifidi, QL and gluteals    PT Home Exercise Plan  [email or web address]    Consulted and Agree with Plan of Care  Patient       Patient will benefit from skilled therapeutic intervention in order to improve the following deficits and impairments:  Pain, Increased muscle spasms, Impaired flexibility, Decreased strength  Visit Diagnosis: Chronic bilateral low back pain without sciatica - Plan: PT plan of care cert/re-cert     Problem List Patient Active Problem List   Diagnosis Date Noted  . Vitamin D deficiency 02/04/2018  . Depression 02/04/2018  . Essential hypertension 11/20/2017  . Class 3 severe obesity with serious comorbidity and body mass index (BMI) of 40.0 to 44.9 in adult (HCC) 11/20/2017  . Serum potassium elevated 03/11/2017  . Chronic kidney disease 03/11/2017  . Insulin resistance 03/11/2017  . Other fatigue 02/25/2017  . Shortness of breath on exertion 02/25/2017  . Prediabetes 02/25/2017    Solon Palm PT 05/19/2019, 4:05 PM  [facility] [address], [address] [place], [place], [address] Phone: [phone]   Fax:  [phone]  Name: Kyesha Balla MRN: [id number] Date of Birth: 1960-10-26

## 2019-05-19 NOTE — Patient Instructions (Signed)
Access Code: [email or web address] URL: [email or web address] Date: 05/19/2019 Prepared by: Raynelle Fanning Torrence Branagan  Exercises Side Lunge Adductor Stretch - 2 x daily - 7 x weekly - 1 sets - 3 reps - 30-60 sec hold Standing Quadratus Lumborum Stretch with Doorway - 2 x daily - 7 x weekly - 1 sets - 3 reps - 30 sec hold Supine March - 1 x daily - 7 x weekly - 2 sets - 10 reps  Patient Education Trigger Point Dry Needling

## 2019-05-21 ENCOUNTER — Ambulatory Visit: Payer: BC Managed Care – PPO | Admitting: Physical Therapy

## 2019-05-21 ENCOUNTER — Encounter: Payer: Self-pay | Admitting: Physical Therapy

## 2019-05-21 ENCOUNTER — Other Ambulatory Visit: Payer: Self-pay

## 2019-05-21 DIAGNOSIS — G8929 Other chronic pain: Secondary | ICD-10-CM

## 2019-05-21 DIAGNOSIS — M545 Low back pain, unspecified: Secondary | ICD-10-CM

## 2019-05-21 NOTE — Patient Instructions (Signed)
Access Code: [email or web address] URL: [email or web address] Date: 05/21/2019 Prepared by: Eulis Foster  Exercises Side Lunge Adductor Stretch - 2 x daily - 7 x weekly - 1 sets - 3 reps - 30-60 sec hold Standing Quadratus Lumborum Stretch with Doorway - 2 x daily - 7 x weekly - 1 sets - 3 reps - 30 sec hold Supine March - 1 x daily - 7 x weekly - 2 sets - 10 reps Hooklying Single Knee to Chest Stretch - 1 x daily - 7 x weekly - 1 sets - 2 reps - 30 sec hold Hooklying Hamstring Stretch with Strap - 1 x daily - 7 x weekly - 1 sets - 2 reps - 30 sec hold  Patient Education Trigger [facility] Needling  [facility] [address], [address] [place], [place] [address] Phone # [phone] Fax [phone]

## 2019-05-21 NOTE — Therapy (Signed)
[facility] [address] [place], [place], [address] Phone: [phone]   Fax:  [phone]  Physical Therapy Treatment  Patient Details  Name: Tracy Beard MRN: [id number] Date of Birth: 1960/05/17 Referring Provider (PT): Lavada Mesi MD   Encounter Date: 05/21/2019  PT End of Session - 05/21/19 0853    Visit Number  2    Date for PT Re-Evaluation  07/14/19    Authorization Type  BCBS    PT Start Time  0845    PT Stop Time  0925    PT Time Calculation (min)  40 min    Activity Tolerance  Patient tolerated treatment well    Behavior During Therapy  [facility] for tasks assessed/performed       Past Medical History:  Diagnosis Date  . Acid reflux   . HTN (hypertension)     History reviewed. No pertinent surgical history.  There were no vitals filed for this visit.  Subjective Assessment - 05/21/19 0852    Subjective  I feel fine after the initial evaluation.    Pertinent History  HTN, obesity, CKD    Patient Stated Goals  strengthen the abdominals and improve core strength    Currently in Pain?  No/denies                       [facility] Adult PT Treatment/Exercise - 05/21/19 0001      Lumbar Exercises: Stretches   Single Knee to Chest Stretch  Right;Left;1 rep;30 seconds    Single Knee to Chest Stretch Limitations  with strap      Lumbar Exercises: Aerobic   Nustep  5 min. at level 1 while assessing patient      Manual Therapy   Manual Therapy  Joint mobilization;Soft tissue mobilization    Joint Mobilization  right sidely with gapping of the T10-L5    Soft tissue mobilization  elongation of the left QL and lumbar paraspinals after dry needling       Trigger Point Dry Needling - 05/21/19 0001    Consent Given?  Yes    Education Handout Provided  Yes    Muscles Treated Back/Hip  Lumbar multifidi    Lumbar multifidi Response  Twitch response elicited;Palpable increased  muscle length           PT Education - 05/21/19 0904    Education Details  Access Code: [email or web address]    Person(s) Educated  Patient    Methods  Explanation;Demonstration;Verbal cues;Handout    Comprehension  Verbalized understanding;Returned demonstration       PT Short Term Goals - 05/19/19 1551      PT SHORT TERM GOAL #1   Title  Ind with initial HEP    Time  3    Period  Weeks    Status  New    Target Date  06/09/19      PT SHORT TERM GOAL #2   Title  Independent in safe exercises to do and modifications to current gym program to protect low back.    Time  4    Period  Weeks    Status  New    Target Date  06/16/19        PT Long Term Goals - 05/19/19 1551      PT LONG TERM GOAL #1   Title  Ind with advanced HEP for strengthening and flexibility to protect her back from injury.    Time  8    Period  Weeks    Status  New    Target Date  07/14/19      PT LONG TERM GOAL #2   Title  Patient to report decreased incidence of pain by 50% or more with gardening and landscaping activities.    Time  8    Period  Weeks    Status  New      PT LONG TERM GOAL #3   Title  Patient to report decreased pain with ADLS by 32% in the low back.    Time  8    Period  Weeks    Status  New      PT LONG TERM GOAL #4   Title  Improved BLE strength to 5/5 and good stabilization of core with MMT    Time  8    Period  Weeks    Status  New      PT LONG TERM GOAL #5   Title  Improvd FOTO score to <= 29% limitation    Baseline  40% limitation    Time  8    Period  Weeks    Status  New            Plan - 05/21/19 0857    Clinical Impression Statement  Patient had increased movement of the thoracolumbar junction after the manual work. Patient still has some trigger points in the left QL. Patient is able to do her exercises without increased pain. Patient needs strengthening of her core and understanding correct body mechanics with daily tasks.    Personal Factors and  Comorbidities  Comorbidity 1    Comorbidities  obesity    Stability/Clinical Decision Making  Stable/Uncomplicated    Rehab Potential  Excellent    PT Frequency  2x / week    PT Duration  8 weeks    PT Treatment/Interventions  ADLs/Self Care Home Management;Cryotherapy;Electrical Stimulation;Moist Heat;Traction;Neuromuscular re-education;Therapeutic exercise;Therapeutic activities;Patient/family education;Manual techniques;Dry needling;Taping;Spinal Manipulations;Joint Manipulations    PT Next Visit Plan  review proper lifting technique, standing/functional low back strengthening (dead lifts, squats, good mornings); transverse abdominus; Hip strength; manual/ DN to lumbar multifidi, QL and gluteals    PT Home Exercise Plan  [email or web address]    Recommended Other Services  MD signed the initial note    Consulted and Agree with Plan of Care  Patient       Patient will benefit from skilled therapeutic intervention in order to improve the following deficits and impairments:  Pain, Increased muscle spasms, Impaired flexibility, Decreased strength  Visit Diagnosis: Chronic bilateral low back pain without sciatica     Problem List Patient Active Problem List   Diagnosis Date Noted  . Vitamin D deficiency 02/04/2018  . Depression 02/04/2018  . Essential hypertension 11/20/2017  . Class 3 severe obesity with serious comorbidity and body mass index (BMI) of 40.0 to 44.9 in adult ([place]) 11/20/2017  . Serum potassium elevated 03/11/2017  . Chronic kidney disease 03/11/2017  . Insulin resistance 03/11/2017  . Other fatigue 02/25/2017  . Shortness of breath on exertion 02/25/2017  . Prediabetes 02/25/2017    Earlie Counts, PT 05/21/19 9:29 AM   [facility] [address] [place], [place], [address] Phone: [phone]   Fax:  [phone]  Name: Tracy Beard MRN: [id number] Date of Birth: 1960-12-13

## 2019-06-02 ENCOUNTER — Other Ambulatory Visit: Payer: Self-pay

## 2019-06-02 ENCOUNTER — Ambulatory Visit: Payer: BC Managed Care – PPO | Attending: Family Medicine | Admitting: Physical Therapy

## 2019-06-02 ENCOUNTER — Encounter: Payer: Self-pay | Admitting: Physical Therapy

## 2019-06-02 DIAGNOSIS — M25561 Pain in right knee: Secondary | ICD-10-CM | POA: Diagnosis present

## 2019-06-02 DIAGNOSIS — M545 Low back pain, unspecified: Secondary | ICD-10-CM

## 2019-06-02 DIAGNOSIS — G8929 Other chronic pain: Secondary | ICD-10-CM | POA: Insufficient documentation

## 2019-06-02 NOTE — Patient Instructions (Signed)
Access Code: [email or web address] URL: [email or web address] Date: 05/21/2019 Prepared by: Eulis Foster  Exercises Side Lunge Adductor Stretch - 2 x daily - 7 x weekly - 1 sets - 3 reps - 30-60 sec hold Standing Quadratus Lumborum Stretch with Doorway - 2 x daily - 7 x weekly - 1 sets - 3 reps - 30 sec hold Supine March - 1 x daily - 7 x weekly - 2 sets - 10 reps Hooklying Single Knee to Chest Stretch - 1 x daily - 7 x weekly - 1 sets - 2 reps - 30 sec hold Hooklying Hamstring Stretch with Strap - 1 x daily - 7 x weekly - 1 sets - 2 reps - 30 sec hold Deadlift with Resistance - 1 x daily - 7 x weekly - 2 sets - 10 reps Standing Shoulder Extension with Resistance - 1 x daily - 7 x weekly - 2 sets - 10 reps Bird Dog - 1 x daily - 7 x weekly - 1 sets - 10 reps - 3 sec hold  Patient Education Trigger [facility] [address], [address] [place], [place] [address] Phone # [phone] Fax [phone]

## 2019-06-02 NOTE — Therapy (Signed)
[facility] [address] [place], [place], [address] Phone: [phone]   Fax:  [phone]  Physical Therapy Treatment  Patient Details  Name: Tracy Beard MRN: [id number] Date of Birth: 1960/06/08 Referring Provider (PT): Lavada Mesi MD   Encounter Date: 06/02/2019  PT End of Session - 06/02/19 1219    Visit Number  3    Date for PT Re-Evaluation  07/14/19    Authorization Type  BCBS    PT Start Time  1145    PT Stop Time  1225    PT Time Calculation (min)  40 min    Activity Tolerance  Patient tolerated treatment well    Behavior During Therapy  [facility] for tasks assessed/performed       Past Medical History:  Diagnosis Date  . Acid reflux   . HTN (hypertension)     History reviewed. No pertinent surgical history.  There were no vitals filed for this visit.  Subjective Assessment - 06/02/19 1150    Subjective  I felt great after last visit. I am tightening up some now.    Pertinent History  HTN, obesity, CKD    Patient Stated Goals  strengthen the abdominals and improve core strength    Currently in Pain?  Yes                       [facility] Adult PT Treatment/Exercise - 06/02/19 0001      Therapeutic Activites    Therapeutic Activities  Lifting    Lifting  education on correct lifting and she returned demostration      Lumbar Exercises: Stretches   Passive Hamstring Stretch  Right;2 reps;30 seconds    Passive Hamstring Stretch Limitations  stretched the right inner thigh    Other Lumbar Stretch Exercise  sitting hip adductor stretch     Other Lumbar Stretch Exercise  right hip ER stretch      Lumbar Exercises: Aerobic   Nustep  5 min. at level 3 while assessing patient      Lumbar Exercises: Standing   Shoulder Extension  Strengthening;15 reps;Both;Theraband    Theraband Level (Shoulder Extension)  Level 3 (Green)    Other Standing Lumbar Exercises  dead lift with  5# in each hand     Other Standing Lumbar Exercises  lift opposite arm and leg leaning on high mat      Manual Therapy   Manual Therapy  Soft tissue mobilization    Soft tissue mobilization  using the addaday for lumbar paraspinals to elongate after dry needling       Trigger Point Dry Needling - 06/02/19 0001    Consent Given?  Yes    Education Handout Provided  Previously provided    Muscles Treated Back/Hip  Lumbar multifidi    Lumbar multifidi Response  Twitch response elicited;Palpable increased muscle length           PT Education - 06/02/19 1227    Education Details  Access Code: [email or web address];lifting with correct body mechanics    Person(s) Educated  Patient    Methods  Explanation;Demonstration;Verbal cues;Handout    Comprehension  Returned demonstration;Verbalized understanding       PT Short Term Goals - 06/02/19 1239      PT SHORT TERM GOAL #1   Title  Ind with initial HEP    Time  3    Period  Weeks    Status  Achieved  PT SHORT TERM GOAL #2   Title  Independent in safe exercises to do and modifications to current gym program to protect low back.    Time  4    Period  Weeks    Status  On-going        PT Long Term Goals - 05/19/19 1551      PT LONG TERM GOAL #1   Title  Ind with advanced HEP for strengthening and flexibility to protect her back from injury.    Time  8    Period  Weeks    Status  New    Target Date  07/14/19      PT LONG TERM GOAL #2   Title  Patient to report decreased incidence of pain by 50% or more with gardening and landscaping activities.    Time  8    Period  Weeks    Status  New      PT LONG TERM GOAL #3   Title  Patient to report decreased pain with ADLS by 16% in the low back.    Time  8    Period  Weeks    Status  New      PT LONG TERM GOAL #4   Title  Improved BLE strength to 5/5 and good stabilization of core with MMT    Time  8    Period  Weeks    Status  New      PT LONG TERM GOAL #5   Title  Improvd  FOTO score to <= 29% limitation    Baseline  40% limitation    Time  8    Period  Weeks    Status  New            Plan - 06/02/19 1217    Clinical Impression Statement  Patient was able to do her proects after last visit without increased in pain. Patient is able to demonstrate good body mechanics with lifting. More tightness on the left lumbar than the right. Unable to do quadruped exercises due to knee pain. Patient will benefit from skilled therapy to strengthen her core and improve function.    Personal Factors and Comorbidities  Comorbidity 1    Comorbidities  obesity    Stability/Clinical Decision Making  Stable/Uncomplicated    Rehab Potential  Excellent    PT Frequency  2x / week    PT Duration  8 weeks    PT Treatment/Interventions  ADLs/Self Care Home Management;Cryotherapy;Electrical Stimulation;Moist Heat;Traction;Neuromuscular re-education;Therapeutic exercise;Therapeutic activities;Patient/family education;Manual techniques;Dry needling;Taping;Spinal Manipulations;Joint Manipulations    PT Next Visit Plan  work on transverse abdominus and hip strength, dry needing to lumbar if needed; go over her gym program to see if modifications need to be donw    PT [place] and Agree with Plan of Care  Patient       Patient will benefit from skilled therapeutic intervention in order to improve the following deficits and impairments:  Pain, Increased muscle spasms, Impaired flexibility, Decreased strength  Visit Diagnosis: Chronic bilateral low back pain without sciatica     Problem List Patient Active Problem List   Diagnosis Date Noted  . Vitamin D deficiency 02/04/2018  . Depression 02/04/2018  . Essential hypertension 11/20/2017  . Class 3 severe obesity with serious comorbidity and body mass index (BMI) of 40.0 to 44.9 in adult ([place]) 11/20/2017  . Serum potassium elevated 03/11/2017  . Chronic kidney disease 03/11/2017  .  Insulin  resistance 03/11/2017  . Other fatigue 02/25/2017  . Shortness of breath on exertion 02/25/2017  . Prediabetes 02/25/2017     [facility] [address] [place], [place], [address] Phone: [phone]   Fax:  [phone]  Name: Stevee Valenta MRN: [id number] Date of Birth: [birth year]

## 2019-06-11 ENCOUNTER — Other Ambulatory Visit: Payer: Self-pay

## 2019-06-11 ENCOUNTER — Ambulatory Visit: Payer: BC Managed Care – PPO | Admitting: Physical Therapy

## 2019-06-11 ENCOUNTER — Encounter: Payer: Self-pay | Admitting: Physical Therapy

## 2019-06-11 DIAGNOSIS — G8929 Other chronic pain: Secondary | ICD-10-CM

## 2019-06-11 DIAGNOSIS — M545 Low back pain, unspecified: Secondary | ICD-10-CM

## 2019-06-11 NOTE — Therapy (Signed)
[facility] [address], [place], [place], [address] Phone: [phone]   Fax:  [phone]  Physical Therapy Treatment  Patient Details  Name: Tracy Beard MRN: [id number] Date of Birth: 01/12/1961 Referring Provider (PT): Eunice Blase MD   Encounter Date: 06/11/2019  PT End of Session - 06/11/19 0754    Visit Number  4    Date for PT Re-Evaluation  07/14/19    Authorization Type  BCBS    PT Start Time  0800    PT Stop Time  0845    PT Time Calculation (min)  45 min    Activity Tolerance  Patient tolerated treatment well    Behavior During Therapy  [facility] for tasks assessed/performed       Past Medical History:  Diagnosis Date  . Acid reflux   . HTN (hypertension)     History reviewed. No pertinent surgical history.  There were no vitals filed for this visit.  Subjective Assessment - 06/11/19 0800    Subjective  I'm still having some stiffness but it's taking  longer to get there on a daily basis. Feeling on left in upper back, not really in the lower back anymore.    Pertinent History  HTN, obesity, CKD    Patient Stated Goals  strengthen the abdominals and improve core strength    Currently in Pain?  No/denies                        [facility] Adult PT Treatment/Exercise - 06/11/19 0001      Self-Care   Self-Care  Other Self-Care Comments    Other Self-Care Comments   MFR with ball to upper back      Lumbar Exercises: Stretches   Passive Hamstring Stretch  Right;Left;1 rep;30 seconds    Passive Hamstring Stretch Limitations  stretched the right inner thigh with strap also    Lower Trunk Rotation  5 reps;10 seconds    Lower Trunk Rotation Limitations  open book thoracic rotation    Other Lumbar Stretch Exercise  server stretch in squat position 3x 10 sec      Lumbar Exercises: Aerobic   Nustep  5 min. at level 3 while assessing patient      Lumbar Exercises: Standing    Wall Slides Limitations  wall angels for thoracic mobilty; also did OH flexion to walll    Shoulder Extension  Strengthening;15 reps;Both;Theraband    Theraband Level (Shoulder Extension)  Level 3 (Green)    Other Standing Lumbar Exercises  dead lift with 5# in each hand  x 10    Other Standing Lumbar Exercises  lift opposite arm and leg leaning on high mat      Lumbar Exercises: Supine   Heel Slides  10 reps    Heel Slides Limitations  bil    Bent Knee Raise Limitations  attempted up/up into 90/90 but too difficult    Straight Leg Raise  10 reps    Straight Leg Raises Limitations  bil               PT Short Term Goals - 06/02/19 1239      PT SHORT TERM GOAL #1   Title  Ind with initial HEP    Time  3    Period  Weeks    Status  Achieved      PT SHORT TERM GOAL #2   Title  Independent in safe exercises  to do and modifications to current gym program to protect low back.    Time  4    Period  Weeks    Status  On-going        PT Long Term Goals - 06/11/19 1647      PT LONG TERM GOAL #1   Title  Ind with advanced HEP for strengthening and flexibility to protect her back from injury.    Status  On-going      PT LONG TERM GOAL #2   Title  Patient to report decreased incidence of pain by 50% or more with gardening and landscaping activities.    Status  On-going            Plan - 06/11/19 1644    Clinical Impression Statement  Patient continues to progress with pain control and function. Feeling in left upper back mostly. HEP was modified to eliminate exercises and progress others. She has limitations with thoracic mobility in standing and rotation.    PT Frequency  2x / week    PT Duration  8 weeks    PT Treatment/Interventions  ADLs/Self Care Home Management;Cryotherapy;Electrical Stimulation;Moist Heat;Traction;Neuromuscular re-education;Therapeutic exercise;Therapeutic activities;Patient/family education;Manual techniques;Dry needling;Taping;Spinal  Manipulations;Joint Manipulations    PT Next Visit Plan  work on transverse abdominus and hip strength, thoracic mobility; DN prn    PT Home Exercise Plan  [email or web address]       Patient will benefit from skilled therapeutic intervention in order to improve the following deficits and impairments:  Pain, Increased muscle spasms, Impaired flexibility, Decreased strength  Visit Diagnosis: Chronic bilateral low back pain without sciatica     Problem List Patient Active Problem List   Diagnosis Date Noted  . Vitamin D deficiency 02/04/2018  . Depression 02/04/2018  . Essential hypertension 11/20/2017  . Class 3 severe obesity with serious comorbidity and body mass index (BMI) of 40.0 to 44.9 in adult ([facility]) 11/20/2017  . Serum potassium elevated 03/11/2017  . Chronic kidney disease 03/11/2017  . Insulin resistance 03/11/2017  . Other fatigue 02/25/2017  . Shortness of breath on exertion 02/25/2017  . Prediabetes 02/25/2017    Solon Palm PT 06/11/2019, 4:50 PM  [facility] [address] [place], [place], [address] Phone: [phone]   Fax:  [phone]  Name: Tracy Beard MRN: [id number] Date of Birth: 01-16-1961

## 2019-06-11 NOTE — Patient Instructions (Signed)
Access Code: [email or web address] URL: [email or web address] Date: 06/11/2019 Prepared by: Raynelle Fanning Cyani Kallstrom  Exercises Side Lunge Adductor Stretch - 2 x daily - 7 x weekly - 1 sets - 3 reps - 30-60 sec hold Standing Quadratus Lumborum Stretch with Doorway - 2 x daily - 7 x weekly - 1 sets - 3 reps - 30 sec hold Hooklying Hamstring Stretch with Strap - 1 x daily - 7 x weekly - 1 sets - 2 reps - 30 sec hold Sidelying Thoracic Rotation with Open Book - 1 x daily - 7 x weekly - 1 sets - 10 reps - 10 sec hold Supine Transversus Abdominis Bracing with Heel Slide - 1 x daily - 7 x weekly - 2 sets - 10 reps Supine Active Straight Leg Raise - 1 x daily - 7 x weekly - 2 sets - 10 reps Deadlift with Resistance - 1 x daily - 7 x weekly - 2 sets - 10 reps Standing Shoulder Extension with Resistance - 1 x daily - 7 x weekly - 2 sets - 10 reps Bird Dog - 1 x daily - 7 x weekly - 1 sets - 10 reps - 3 sec hold  Patient Education Trigger Point Dry Needling

## 2019-06-16 ENCOUNTER — Encounter: Payer: Self-pay | Admitting: Physical Therapy

## 2019-06-16 ENCOUNTER — Other Ambulatory Visit: Payer: Self-pay

## 2019-06-16 ENCOUNTER — Ambulatory Visit: Payer: BC Managed Care – PPO | Admitting: Physical Therapy

## 2019-06-16 DIAGNOSIS — M545 Low back pain, unspecified: Secondary | ICD-10-CM

## 2019-06-16 DIAGNOSIS — G8929 Other chronic pain: Secondary | ICD-10-CM

## 2019-06-16 NOTE — Therapy (Signed)
[facility] [address] [place], [place], [address] Phone: [phone]   Fax:  [phone]  Physical Therapy Treatment  Patient Details  Name: Tracy Beard MRN: [id number] Date of Birth: 01-21-1961 Referring Provider (PT): Eunice Blase MD   Encounter Date: 06/16/2019  PT End of Session - 06/16/19 0851    Visit Number  5    Date for PT Re-Evaluation  07/14/19    Authorization Type  BCBS    PT Start Time  0845    PT Stop Time  0925    PT Time Calculation (min)  40 min    Activity Tolerance  Patient tolerated treatment well;No increased pain    Behavior During Therapy  WFL for tasks assessed/performed       Past Medical History:  Diagnosis Date  . Acid reflux   . HTN (hypertension)     History reviewed. No pertinent surgical history.  There were no vitals filed for this visit.  Subjective Assessment - 06/16/19 0848    Subjective  Pt states that that she is doing well. She feels atleast 50% improved since starting PT. Her biggest issue is her mid upper back that bothers her as the day goes on.    Pertinent History  HTN, obesity, CKD    Patient Stated Goals  strengthen the abdominals and improve core strength    Currently in Pain?  No/denies                        [facility] Adult PT Treatment/Exercise - 06/16/19 0001      Exercises   Exercises  Lumbar      Lumbar Exercises: Aerobic   Nustep  6 min. at level 3 while assessing patient      Lumbar Exercises: Seated   Hip Flexion on Ball  Both;10 reps    Hip Flexion on Ball Limitations  x2 sets on dyna disc   (+) posterior lean with Rt hip flexion   Other Seated Lumbar Exercises  on dyna disc with pallof press green TB x15 reps     Other Seated Lumbar Exercises  thoracic rotation with PT overpressure x10 reps each direction       Lumbar Exercises: Supine   Isometric Hip Flexion  10 reps;2 seconds    Isometric Hip Flexion  Limitations  abominal bracing     Other Supine Lumbar Exercises  BLE resting on 6" box with alternating hip flexion 2x5 reps     Other Supine Lumbar Exercises  hooklying alternating UE reach with yellow TB 2x10 reps              PT Education - 06/16/19 0925    Education Details  updates to HEP; technique with therex    Person(s) Educated  Patient    Methods  Explanation;Verbal cues;Handout    Comprehension  Verbalized understanding;Returned demonstration       PT Short Term Goals - 06/02/19 1239      PT SHORT TERM GOAL #1   Title  Ind with initial HEP    Time  3    Period  Weeks    Status  Achieved      PT SHORT TERM GOAL #2   Title  Independent in safe exercises to do and modifications to current gym program to protect low back.    Time  4    Period  Weeks    Status  On-going  PT Long Term Goals - 06/11/19 1647      PT LONG TERM GOAL #1   Title  Ind with advanced HEP for strengthening and flexibility to protect her back from injury.    Status  On-going      PT LONG TERM GOAL #2   Title  Patient to report decreased incidence of pain by 50% or more with gardening and landscaping activities.    Status  On-going            Plan - 06/16/19 0927    Clinical Impression Statement  Pt continues to make steady progress towards her goals. She feels atleast 50% improved since starting PT and has been completing her HEP consistently without significant difficulty. Today's session continued with focus on increasing deep abdominal control and endurance. Pt was able to progress to seated position, but she did have more difficulty with Lt trunk stability when flexing the Rt hip. Pt denied increase in pain end of session, noting upper back fatigue only. Will continue with current POC.    PT Frequency  2x / week    PT Duration  8 weeks    PT Treatment/Interventions  ADLs/Self Care Home Management;Cryotherapy;Electrical Stimulation;Moist Heat;Traction;Neuromuscular  re-education;Therapeutic exercise;Therapeutic activities;Patient/family education;Manual techniques;Dry needling;Taping;Spinal Manipulations;Joint Manipulations    PT Next Visit Plan  check hip flexibility/strength progression; seated abdominal/standing pallof green TB    PT Home Exercise Plan  [email or web address]       Patient will benefit from skilled therapeutic intervention in order to improve the following deficits and impairments:  Pain, Increased muscle spasms, Impaired flexibility, Decreased strength  Visit Diagnosis: Chronic bilateral low back pain without sciatica     Problem List Patient Active Problem List   Diagnosis Date Noted  . Vitamin D deficiency 02/04/2018  . Depression 02/04/2018  . Essential hypertension 11/20/2017  . Class 3 severe obesity with serious comorbidity and body mass index (BMI) of 40.0 to 44.9 in adult (HCC) 11/20/2017  . Serum potassium elevated 03/11/2017  . Chronic kidney disease 03/11/2017  . Insulin resistance 03/11/2017  . Other fatigue 02/25/2017  . Shortness of breath on exertion 02/25/2017  . Prediabetes 02/25/2017    9:29 AM,06/16/19 Donita Brooks PT, DPT [facility]  [phone]  [facility] [address] [place], [place], [address] Phone: [phone]   Fax:  [phone]  Name: Mikaiah Stoffer MRN: [id number] Date of Birth: 03-24-1960

## 2019-06-16 NOTE — Patient Instructions (Signed)
Access Code: [email or web address]: [email or web address]: 05/18/2021Prepared by: [facility]  Side Lunge Adductor Stretch - 2 x daily - 7 x weekly - 1 sets - 3 reps - 30-60 sec hold  Standing Quadratus Lumborum Stretch with Doorway - 2 x daily - 7 x weekly - 1 sets - 3 reps - 30 sec hold  Sidelying Thoracic Rotation with Open Book - 1 x daily - 7 x weekly - 1 sets - 10 reps - 10 sec hold  Supine Active Straight Leg Raise - 1 x daily - 7 x weekly - 2 sets - 10 reps  Deadlift with Resistance - 1 x daily - 7 x weekly - 2 sets - 10 reps  Standing Shoulder Extension with Resistance - 1 x daily - 7 x weekly - 2 sets - 10 reps Patient Education  Trigger [facility] Dry Needling   [facility] [address], [address] [place], [place] [address] Phone # [phone] Fax [phone]

## 2019-06-18 ENCOUNTER — Ambulatory Visit: Payer: BC Managed Care – PPO | Admitting: Physical Therapy

## 2019-06-22 ENCOUNTER — Other Ambulatory Visit: Payer: Self-pay

## 2019-06-22 ENCOUNTER — Ambulatory Visit: Payer: BC Managed Care – PPO | Admitting: Family Medicine

## 2019-06-22 ENCOUNTER — Encounter: Payer: Self-pay | Admitting: Family Medicine

## 2019-06-22 DIAGNOSIS — M25561 Pain in right knee: Secondary | ICD-10-CM | POA: Diagnosis not present

## 2019-06-22 NOTE — Progress Notes (Signed)
Office Visit Note   Patient: Tracy Beard           Date of Birth: August 29, 1960           MRN: [id number] Visit Date: 06/22/2019 Requested by: Marda Stalker, PA-C [place],   [id number] PCP: Marda Stalker, PA-C  Subjective: Chief Complaint  Patient presents with  . Right Lower Leg - Pain    Pain from just below the knee (anterior) and down lateral lower leg to the foot. Tingling in the foot. Started last Wednesday and worsened the next day - has lessened since. Had an episode like this early April, lasting 2-3 days.    HPI: He is here with right knee pain.  We briefly discussed this last time, she thought it was hurting due to favoring her low back and left leg issues.  But last week it flared up again without injury.  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  She started noticing pain on the medial and lateral joint lines with tingling sensation down the side of her lower leg to the foot.  She "works through the pain" and continued walking, and it has gotten significantly better since then.  Today she actually feels pretty close to normal but she is concerned that it might happen again.  She is currently in therapy for her low back and is doing well.                ROS:   All other systems were reviewed and are negative.  Objective: Vital Signs: There were no vitals taken for this visit.  Physical Exam:  General:  Alert and oriented, in no acute distress. Pulm:  Breathing unlabored. Psy:  Normal mood, congruent affect. Skin: No rash Right knee: 1+ effusion with no warmth.  Very tender on the medial and lateral joint lines.  No palpable click with McMurray's.  No laxity with varus or valgus stress.  She is tender near the common peroneal nerve, no paresthesias.  Lower extremity strength is normal.  Imaging: No results found.  Assessment & Plan: 1.  Recurrent right knee pain, question degenerative  meniscus tear.  Tingling symptoms are suspicious for peroneal nerve irritation. -Discussed options with her and elected to add physical therapy for her right knee.  If symptoms persist then MRI scan of the knee.     Procedures: No procedures performed  No notes on file     PMFS History: Patient Active Problem List   Diagnosis Date Noted  . Vitamin D deficiency 02/04/2018  . Depression 02/04/2018  . Essential hypertension 11/20/2017  . Class 3 severe obesity with serious comorbidity and body mass index (BMI) of 40.0 to 44.9 in adult ([place]) 11/20/2017  . Serum potassium elevated 03/11/2017  . Chronic kidney disease 03/11/2017  . Insulin resistance 03/11/2017  . Other fatigue 02/25/2017  . Shortness of breath on exertion 02/25/2017  . Prediabetes 02/25/2017   Past Medical History:  Diagnosis Date  . Acid reflux   . HTN (hypertension)     Family History  Problem Relation Age of Onset  . Hypertension Mother   . Hypertension Father     History reviewed. No pertinent surgical history. Social History   Occupational History  . Occupation: Principal  Tobacco Use  . Smoking status: Former Smoker    Packs/day: 1.00    Years: 24.00    Pack years: 24.00    Quit date: 1997  Years since quitting: 24.4  . Smokeless tobacco: Never Used  Substance and Sexual Activity  . Alcohol use: No  . Drug use: No  . Sexual activity: Yes    Partners: Female    Birth control/protection: None

## 2019-06-23 ENCOUNTER — Ambulatory Visit: Payer: BC Managed Care – PPO | Admitting: Physical Therapy

## 2019-06-23 DIAGNOSIS — G8929 Other chronic pain: Secondary | ICD-10-CM

## 2019-06-23 DIAGNOSIS — M545 Low back pain, unspecified: Secondary | ICD-10-CM

## 2019-06-23 DIAGNOSIS — M25561 Pain in right knee: Secondary | ICD-10-CM

## 2019-06-23 NOTE — Patient Instructions (Signed)
Access Code: [email or web address]: [email or web address]: 05/18/2021Prepared by: [facility]erc[facility]  Side Lunge Adductor Stretch - 2 x daily - 7 x weekly - 1 sets - 3 reps - 30-60 sec hold  Sidelying Thoracic Rotation with Open Book - 1 x daily - 7 x weekly - 1 sets - 10 reps - 10 sec hold  Supine Active Straight Leg Raise - 1 x daily - 7 x weekly - 2 sets - 10 reps  Deadlift with Resistance - 1 x daily - 7 x weekly - 2 sets - 10 reps  Standing Shoulder Extension with Resistance - 1 x daily - 7 x weekly - 2 sets - 10 reps  Quadricep Stretch with Chair and Counter Support - 2 x daily - 7 x weekly - 2 reps - 30 seconds hold  [facility] [address] [place], [place] [address] Phone # [phone] Fax [phone]

## 2019-06-23 NOTE — Therapy (Signed)
[facility] [address] [place], [place], [address] Phone: [phone]   Fax:  [phone]  Physical Therapy Treatment  Patient Details  Name: Tracy Beard MRN: [id number] Date of Birth: [birth year] Referring Provider (PT): Eunice Blase MD   Encounter Date: 06/23/2019  PT End of Session - 06/23/19 0926    Visit Number  6    Date for PT Re-Evaluation  07/14/19    Authorization Type  BCBS    PT Start Time  0846    PT Stop Time  0929    PT Time Calculation (min)  43 min    Activity Tolerance  Patient tolerated treatment well;No increased pain    Behavior During Therapy  WFL for tasks assessed/performed       Past Medical History:  Diagnosis Date  . Acid reflux   . HTN (hypertension)     No past surgical history on file.  There were no vitals filed for this visit.  Subjective Assessment - 06/23/19 0847    Subjective  Pt states that things are going well. She started having Rt knee pain again last week. Since then, it has eased up, but it has not bothered her since April when she initially noticed it.    Pertinent History  HTN, obesity, CKD    Patient Stated Goals  strengthen the abdominals and improve core strength    Currently in Pain?  No/denies         [facility] PT Assessment - 06/23/19 0001      Assessment   Medical Diagnosis  lumbar DDD;acute Rt knee pain    Referring Provider (PT)  Eunice Blase MD    Onset Date/Surgical Date  04/14/19    Next MD Visit  none    Prior Therapy  no      Precautions   Precautions  None      Restrictions   Weight Bearing Restrictions  No      Balance Screen   Has the patient fallen in the past 6 months  No    Has the patient had a decrease in activity level because of a fear of falling?   No    Is the patient reluctant to leave their home because of a fear of falling?   No      Home Film/video editor residence      Prior Function   Level of Independence  Independent    Vocation  Retired    Designer, jewellery, Biomedical scientist      AROM   Overall AROM Comments  Rt knee WNL      Strength   Overall Strength Comments  Bil hip flex 5/5, ABD 4/5, ext 4+/5; Bil knees 5/5; Weak core with MMT; good base transverse abominus but weak with progressive TE    Strength Assessment Site  Knee    Right/Left Knee  --   Rt knee 5/5 MMT      Flexibility   Soft Tissue Assessment /Muscle Length  yes    Hamstrings  WNL    Quadriceps  Lt 120 deg, Rt 105 deg     Piriformis  Rt more limited than Lt     Quadratus Lumborum  left QL; right hip ADDuctors      Palpation   Spinal mobility  --    Palpation comment  tenderness Rt lateral knee joint line, Rt fibula head tender, Rt peroneals  [facility] Adult PT Treatment/Exercise - 06/23/19 0001      Lumbar Exercises: Stretches   Lobbyist  Right;2 reps;30 seconds    Quad Stretch Limitations  prone     Other Lumbar Stretch Exercise  attempted Rt gluteal/piriformis stretch in supine with PT assistance    Other Lumbar Stretch Exercise  standing Rt quad stretch 2x30 sec       Lumbar Exercises: Aerobic   Nustep  6 min, L1 PT present to discuss new onset knee pain       Lumbar Exercises: Standing   Other Standing Lumbar Exercises  power tower: #25 diagonal chops x15 reps each direction, #15 lifts each direction x15 reps              PT Education - 06/23/19 0925    Education Details  technique with therex    Person(s) Educated  Patient    Methods  Explanation;Verbal cues;Handout    Comprehension  Verbalized understanding;Returned demonstration       PT Short Term Goals - 06/23/19 1337      PT SHORT TERM GOAL #1   Title  Ind with initial HEP    Time  3    Period  Weeks    Status  Achieved      PT SHORT TERM GOAL #2   Title  Independent in safe exercises to do and modifications to current gym program to protect low back.    Time   4    Period  Weeks    Status  On-going        PT Long Term Goals - 06/23/19 1337      PT LONG TERM GOAL #1   Title  Ind with advanced HEP for strengthening and flexibility to protect her back from injury.    Status  On-going      PT LONG TERM GOAL #2   Title  Patient to report decreased incidence of pain by 50% or more with gardening and landscaping activities.    Status  On-going      PT LONG TERM GOAL #3   Title  Pt will have greater than 110 deg of prone Rt knee flexion to reflect increase in Rt quadriceps flexibility.    Time  6    Period  Weeks    Status  New            Plan - 06/23/19 1105    Clinical Impression Statement  Pt arrives with complaints of Rt sided knee pain/tingling down to the foot noticed last week. It has resolved now, but she was referred for further evaluation of her Rt knee pain by her initial referring physician. Pt does not have knee pain upon arrival, but she is tender along the Rt lateral knee and fibular head. Knee strength is full and pain free. She has restrictions in Rt hip external rotation and limited quadriceps flexibility to 105 degrees. Pt would benefit from current POC addressing core strength, hip flexibility and strength progression to address both the low back and knee impairments. We will continue to monitor pt's Rt knee pain and make exercise adjustments as needed. PT discussed safe exercises she can complete at her local gym during today's visit.    PT Frequency  2x / week    PT Duration  8 weeks    PT Treatment/Interventions  ADLs/Self Care Home Management;Cryotherapy;Electrical Stimulation;Moist Heat;Traction;Neuromuscular re-education;Therapeutic exercise;Therapeutic activities;Patient/family education;Manual techniques;Dry needling;Taping;Spinal Manipulations;Joint Manipulations    PT Next Visit Plan  hip strength  progression; seated abdominal/standing pallof green TB; slump test    PT Home Exercise Plan  [email or web address]       Patient  will benefit from skilled therapeutic intervention in order to improve the following deficits and impairments:  Pain, Increased muscle spasms, Impaired flexibility, Decreased strength  Visit Diagnosis: Chronic bilateral low back pain without sciatica  Acute pain of right knee     Problem List Patient Active Problem List   Diagnosis Date Noted  . Vitamin D deficiency 02/04/2018  . Depression 02/04/2018  . Essential hypertension 11/20/2017  . Class 3 severe obesity with serious comorbidity and body mass index (BMI) of 40.0 to 44.9 in adult (HCC) 11/20/2017  . Serum potassium elevated 03/11/2017  . Chronic kidney disease 03/11/2017  . Insulin resistance 03/11/2017  . Other fatigue 02/25/2017  . Shortness of breath on exertion 02/25/2017  . Prediabetes 02/25/2017    1:48 PM,06/23/19 Donita Brooks PT, DPT [facility] [place]  [phone]  [facility] [address] [place], [place], [address] Phone: [phone]   Fax:  [phone]  Name: Tracy Beard MRN: [id number] Date of Birth: [birth year]

## 2019-06-25 ENCOUNTER — Ambulatory Visit: Payer: BC Managed Care – PPO | Admitting: Physical Therapy

## 2019-06-25 ENCOUNTER — Other Ambulatory Visit: Payer: Self-pay

## 2019-06-25 ENCOUNTER — Encounter: Payer: Self-pay | Admitting: Physical Therapy

## 2019-06-25 DIAGNOSIS — M545 Low back pain, unspecified: Secondary | ICD-10-CM

## 2019-06-25 DIAGNOSIS — G8929 Other chronic pain: Secondary | ICD-10-CM

## 2019-06-25 DIAGNOSIS — M25561 Pain in right knee: Secondary | ICD-10-CM

## 2019-06-25 NOTE — Therapy (Signed)
[facility] [address] [place], [place], [address] Phone: [phone]   Fax:  [phone]  Physical Therapy Treatment  Patient Details  Name: Tracy Beard MRN: [id number] Date of Birth: [birth year] Referring Provider (PT): Lavada Mesi MD   Encounter Date: 06/25/2019  PT End of Session - 06/25/19 0851    Visit Number  7    Date for PT Re-Evaluation  07/14/19    Authorization Type  BCBS    PT Start Time  0845    PT Stop Time  0925    PT Time Calculation (min)  40 min    Activity Tolerance  Patient tolerated treatment well;No increased pain    Behavior During Therapy  WFL for tasks assessed/performed       Past Medical History:  Diagnosis Date  . Acid reflux   . HTN (hypertension)     History reviewed. No pertinent surgical history.  There were no vitals filed for this visit.  Subjective Assessment - 06/25/19 0850    Subjective  Pt states that her knee and hips were sore following her last session but today they are fine.    Pertinent History  HTN, obesity, CKD    Patient Stated Goals  strengthen the abdominals and improve core strength    Currently in Pain?  No/denies                        [facility] Adult PT Treatment/Exercise - 06/25/19 0001      Lumbar Exercises: Standing   Other Standing Lumbar Exercises  pallof press in tandem (each LE forward) Lt and Rt band pull x15 reps each     Other Standing Lumbar Exercises  forward lean hip extension #3 ankle weights 2x10 reps, forearms on countertop       Lumbar Exercises: Seated   Other Seated Lumbar Exercises  seated on dyna disc blue TB x15 reps each: 2nd set with 1 LE support only       Lumbar Exercises: Supine   Bent Knee Raise  10 reps;5 seconds    Bent Knee Raise Limitations  BLE lift off 8" box       Lumbar Exercises: Sidelying   Hip Abduction  10 reps;Both    Hip Abduction Limitations  x2 sets               PT Education - 06/25/19 0928    Education Details  technique with therex; drop in PT frequency    Person(s) Educated  Patient    Methods  Explanation;Verbal cues    Comprehension  Verbalized understanding;Returned demonstration       PT Short Term Goals - 06/23/19 1337      PT SHORT TERM GOAL #1   Title  Ind with initial HEP    Time  3    Period  Weeks    Status  Achieved      PT SHORT TERM GOAL #2   Title  Independent in safe exercises to do and modifications to current gym program to protect low back.    Time  4    Period  Weeks    Status  On-going        PT Long Term Goals - 06/23/19 1337      PT LONG TERM GOAL #1   Title  Ind with advanced HEP for strengthening and flexibility to protect her back from injury.    Status  On-going  PT LONG TERM GOAL #2   Title  Patient to report decreased incidence of pain by 50% or more with gardening and landscaping activities.    Status  On-going      PT LONG TERM GOAL #3   Title  Pt will have greater than 110 deg of prone Rt knee flexion to reflect increase in Rt quadriceps flexibility.    Time  6    Period  Weeks    Status  New            Plan - 06/25/19 1448    Clinical Impression Statement  Pt denies pain upon arrival but had increased soreness of the hips following her last session. Introduced more hip strengthening therex to target areas of weakness noted at her previous appointment. Pt is doing well with exercise technique and understanding of abdominal bracing during her daily activity. She is returning to the gym next week. Due to her increasing understanding of proper mechanics and exercise technique, we discussed decreasing PT frequency to 1x/week to allow more independence with exercise progressions and full transition back to the gym.    PT Frequency  2x / week    PT Duration  8 weeks    PT Treatment/Interventions  ADLs/Self Care Home Management;Cryotherapy;Electrical Stimulation;Moist  Heat;Traction;Neuromuscular re-education;Therapeutic exercise;Therapeutic activities;Patient/family education;Manual techniques;Dry needling;Taping;Spinal Manipulations;Joint Manipulations    PT Next Visit Plan  hip strength progression; f/u on gym; begin to finalize [place]       Patient will benefit from skilled therapeutic intervention in order to improve the following deficits and impairments:  Pain, Increased muscle spasms, Impaired flexibility, Decreased strength  Visit Diagnosis: Chronic bilateral low back pain without sciatica  Acute pain of right knee     Problem List Patient Active Problem List   Diagnosis Date Noted  . Vitamin D deficiency 02/04/2018  . Depression 02/04/2018  . Essential hypertension 11/20/2017  . Class 3 severe obesity with serious comorbidity and body mass index (BMI) of 40.0 to 44.9 in adult ([place]) 11/20/2017  . Serum potassium elevated 03/11/2017  . Chronic kidney disease 03/11/2017  . Insulin resistance 03/11/2017  . Other fatigue 02/25/2017  . Shortness of breath on exertion 02/25/2017  . Prediabetes 02/25/2017    10:16 AM,06/25/19 Sherol Dade PT, DPT [place] [facility] [place]  [facility] [address], [place], [place], [address] Phone: [phone]   Fax:  [phone]  Name: Tracy Beard MRN: [id number] Date of Birth: 05/21/1960

## 2019-07-07 ENCOUNTER — Encounter: Payer: Self-pay | Admitting: Physical Therapy

## 2019-07-07 ENCOUNTER — Other Ambulatory Visit: Payer: Self-pay

## 2019-07-07 ENCOUNTER — Ambulatory Visit: Payer: BC Managed Care – PPO | Attending: Family Medicine | Admitting: Physical Therapy

## 2019-07-07 DIAGNOSIS — G8929 Other chronic pain: Secondary | ICD-10-CM | POA: Insufficient documentation

## 2019-07-07 DIAGNOSIS — M545 Low back pain, unspecified: Secondary | ICD-10-CM

## 2019-07-07 DIAGNOSIS — M25561 Pain in right knee: Secondary | ICD-10-CM

## 2019-07-07 NOTE — Patient Instructions (Signed)
Access Code: [email or web address]: [email or web address]: 06/08/2021Prepared by: [facility] - Out[facility]fieldExercises  Deadlift with Resistance - 1 x daily - 7 x weekly - 2-3 sets - 10 reps  Sidelying Hip Abduction - 1 x daily - 7 x weekly - 10 reps - 3 sets  Standing Shoulder Extension with Resistance - 1 x daily - 7 x weekly - 3 sets - 10 reps  Quadricep Stretch with Chair and Counter Support - 2 x daily - 7 x weekly - 2 reps - 30 seconds hold  Side Lunge Adductor Stretch - 2 x daily - 7 x weekly - 1 sets - 3 reps - 30-60 sec hold  Sidelying Thoracic Rotation with Open Book - 1 x daily - 7 x weekly - 1 sets - 10 reps - 10 sec hold  Half Kneeling Anti-Rotation Press - Forward Leg Anchor Side - 1 x daily - 4 x weekly - 2 sets - 15 reps  Standing Diagonal Lift with Anchored Resistance - 1 x daily - 4 x weekly - 2 sets - 10-15 reps  [facility] [address] [place], [place] [address] Phone # [phone] Fax [phone]

## 2019-07-07 NOTE — Therapy (Addendum)
[facility] [address] [place], [place], [address] Phone: [phone]   Fax:  [phone]  Physical Therapy Treatment/Discharge  Patient Details  Name: Tracy Beard MRN: [id number] Date of Birth: Feb 13, 1960 Referring Provider (PT): Eunice Blase MD   Encounter Date: 07/07/2019  PT End of Session - 07/07/19 0917    Visit Number  8    Date for PT Re-Evaluation  07/14/19    Authorization Type  BCBS    PT Start Time  0847    PT Stop Time  0929    PT Time Calculation (min)  42 min    Activity Tolerance  Patient tolerated treatment well;No increased pain    Behavior During Therapy  WFL for tasks assessed/performed       Past Medical History:  Diagnosis Date  . Acid reflux   . HTN (hypertension)     History reviewed. No pertinent surgical history.  There were no vitals filed for this visit.  Subjective Assessment - 07/07/19 0847    Subjective  Pt states that things are going well. She is having sharp pain in the front of the knee when walking.    Pertinent History  HTN, obesity, CKD    Patient Stated Goals  strengthen the abdominals and improve core strength    Currently in Pain?  Yes    Pain Location  Knee    Pain Orientation  Right;Anterior    Pain Descriptors / Indicators  Sharp    Pain Type  Acute pain    Pain Radiating Towards  none    Pain Onset  More than a month ago         [facility] PT Assessment - 07/07/19 0001      Flexibility   Quadriceps  BLE 120 deg                     [facility] Adult PT Treatment/Exercise - 07/07/19 0001      Lumbar Exercises: Machines for Strengthening   Leg Press  seat 8: 115lb x10 reps technique education       Lumbar Exercises: Standing   Other Standing Lumbar Exercises  BUE pressdown with #20 x15 reps     Other Standing Lumbar Exercises  deadlift holding #10 dumbbells 2x10 reps (+) trunk flexion noted but improved with black TB resistance  for hip hinge; tandem pallof press with blue TB x15 reps each direction; diagonal lifts with blue TB x15 reps each direction             PT Education - 07/07/19 0915    Education Details  technique with therex    Person(s) Educated  Patient    Methods  Explanation;Verbal cues;Handout    Comprehension  Verbalized understanding;Returned demonstration       PT Short Term Goals - 06/23/19 1337      PT SHORT TERM GOAL #1   Title  Ind with initial HEP    Time  3    Period  Weeks    Status  Achieved      PT SHORT TERM GOAL #2   Title  Independent in safe exercises to do and modifications to current gym program to protect low back.    Time  4    Period  Weeks    Status  On-going        PT Long Term Goals - 07/07/19 0921      PT LONG TERM GOAL #1  Title  Ind with advanced HEP for strengthening and flexibility to protect her back from injury.    Status  On-going      PT LONG TERM GOAL #2   Title  Patient to report decreased incidence of pain by 50% or more with gardening and landscaping activities.    Status  On-going      PT LONG TERM GOAL #3   Title  Pt will have greater than 110 deg of prone Rt knee flexion to reflect increase in Rt quadriceps flexibility.    Baseline  120 deg    Time  6    Period  Weeks    Status  Achieved            Plan - 07/07/19 0959    Clinical Impression Statement  Pt is making steady progress towards her goals. She has increased Rt knee flexibility to 120 deg prone flexion. Session focused on educating the pt on safe equipment use and exercise progression at the gym. Pt had difficulty initially with deadlift form, demonstrating increased trunk flexion/extension. PT was able to improve her posture following tactile and verbal cuing from PT. Pt denied increase in pain during session. HEP was updated to reflect progressions with exercises.    PT Frequency  2x / week    PT Duration  8 weeks    PT Treatment/Interventions  ADLs/Self Care  Home Management;Cryotherapy;Electrical Stimulation;Moist Heat;Traction;Neuromuscular re-education;Therapeutic exercise;Therapeutic activities;Patient/family education;Manual techniques;Dry needling;Taping;Spinal Manipulations;Joint Manipulations    PT Next Visit Plan  hip strength progression; f/u on gym; d/c if no issues    PT Home Exercise Plan  [id number]       Patient will benefit from skilled therapeutic intervention in order to improve the following deficits and impairments:  Pain, Increased muscle spasms, Impaired flexibility, Decreased strength  Visit Diagnosis: Chronic bilateral low back pain without sciatica  Acute pain of right knee     Problem List Patient Active Problem List   Diagnosis Date Noted  . Vitamin D deficiency 02/04/2018  . Depression 02/04/2018  . Essential hypertension 11/20/2017  . Class 3 severe obesity with serious comorbidity and body mass index (BMI) of 40.0 to 44.9 in adult ([place]) 11/20/2017  . Serum potassium elevated 03/11/2017  . Chronic kidney disease 03/11/2017  . Insulin resistance 03/11/2017  . Other fatigue 02/25/2017  . Shortness of breath on exertion 02/25/2017  . Prediabetes 02/25/2017    12:04 PM,07/07/19 [place], DPT [place] [facility] [place]  [facility] [address] [place], [place], [address] Phone: [phone]   Fax:  [phone]  Name: Tracy Beard MRN: [id number] Date of Birth: 23-Aug-1960    *addendum to resolve episode of care and d/c pt from PT  [place]  Visits from Start of Care: 8  Current functional level related to goals / functional outcomes: See above for more details    Remaining deficits: See above for more details    Education / Equipment: See above for more details   Plan: Patient agrees to discharge.  Patient goals were met. Patient is being  discharged due to being pleased with the current functional level.  ?????

## 2019-07-09 ENCOUNTER — Encounter: Payer: BC Managed Care – PPO | Admitting: Physical Therapy

## 2019-07-14 ENCOUNTER — Encounter: Payer: BC Managed Care – PPO | Admitting: Physical Therapy

## 2019-07-16 ENCOUNTER — Encounter: Payer: BC Managed Care – PPO | Admitting: Physical Therapy

## 2019-07-21 ENCOUNTER — Encounter: Payer: BC Managed Care – PPO | Admitting: Physical Therapy

## 2019-07-23 ENCOUNTER — Encounter: Payer: BC Managed Care – PPO | Admitting: Physical Therapy

## 2019-07-28 ENCOUNTER — Encounter: Payer: BC Managed Care – PPO | Admitting: Physical Therapy

## 2019-07-30 ENCOUNTER — Encounter: Payer: BC Managed Care – PPO | Admitting: Physical Therapy

## 2019-10-12 ENCOUNTER — Encounter: Payer: Self-pay | Admitting: Family Medicine

## 2019-10-12 ENCOUNTER — Ambulatory Visit (INDEPENDENT_AMBULATORY_CARE_PROVIDER_SITE_OTHER): Payer: BC Managed Care – PPO | Admitting: Family Medicine

## 2019-10-12 ENCOUNTER — Other Ambulatory Visit: Payer: Self-pay

## 2019-10-12 DIAGNOSIS — M25562 Pain in left knee: Secondary | ICD-10-CM | POA: Diagnosis not present

## 2019-10-12 NOTE — Progress Notes (Signed)
Aug 12th pain in left knee started

## 2019-10-12 NOTE — Progress Notes (Signed)
Office Visit Note   Patient: Tracy Beard           Date of Birth: 1960/12/19           MRN: [id number] Visit Date: 10/12/2019 Requested by: Jarrett Soho, PA-C [address] [place],  [place] [address] PCP: Jarrett Soho, PA-C  Subjective: Chief Complaint  Patient presents with  . Left Knee - Pain    HPI: She is here with left knee pain.  Symptoms started about a month ago, no injury.  Fairly intense rapid onset of 8/10 pain all across the knee, eventually focusing on to the back of the knee.  She made an appointment 2 weeks ago and since then her pain has virtually gone away.  She is been using Voltaren gel with good relief.  One night when the pain was severe she took ibuprofen and it helped, but she is very reluctant to take oral NSAIDs due to chronic kidney disease.  Her right knee still bothers her some but it is much more tolerable now.  Pain pattern was different on the right.  No personal history of gout.              ROS:   All other systems were reviewed and are negative.  Objective: Vital Signs: There were no vitals taken for this visit.  Physical Exam:  General:  Alert and oriented, in no acute distress. Pulm:  Breathing unlabored. Psy:  Normal mood, congruent affect. Skin: No erythema Left knee: 1+ patellofemoral crepitus, no pain with patella compression.  Trace effusion with no warmth.  No laxity with varus or valgus stress.  Mild medial joint line tenderness but no palpable click with McMurray's.  Mild tenderness in the popliteal fossa, no definite Baker's cyst.  Imaging: No results found.  Assessment & Plan: 1.  Improved left knee pain, etiology uncertain.  Question DJD. -If it flares up again, we will order x-rays and possibly consider a one-time cortisone injection.  If her pain is most intense in the posterior knee, we will image it with ultrasound to look for a popliteal cyst.  Otherwise I will see her back as  needed.     Procedures: No procedures performed  No notes on file     PMFS History: Patient Active Problem List   Diagnosis Date Noted  . Vitamin D deficiency 02/04/2018  . Depression 02/04/2018  . Essential hypertension 11/20/2017  . Class 3 severe obesity with serious comorbidity and body mass index (BMI) of 40.0 to 44.9 in adult ([facility]) 11/20/2017  . Serum potassium elevated 03/11/2017  . Chronic kidney disease 03/11/2017  . Insulin resistance 03/11/2017  . Other fatigue 02/25/2017  . Shortness of breath on exertion 02/25/2017  . Prediabetes 02/25/2017   Past Medical History:  Diagnosis Date  . Acid reflux   . HTN (hypertension)     Family History  Problem Relation Age of Onset  . Hypertension Mother   . Hypertension Father     History reviewed. No pertinent surgical history. Social History   Occupational History  . Occupation: Principal  Tobacco Use  . Smoking status: Former Smoker    Packs/day: 1.00    Years: 24.00    Pack years: 24.00    Quit date: 1997    Years since quitting: 24.7  . Smokeless tobacco: Never Used  Vaping Use  . Vaping Use: Never used  Substance and Sexual Activity  . Alcohol use: No  . Drug use: No  .  Sexual activity: Yes    Partners: Female    Birth control/protection: None

## 2019-10-21 ENCOUNTER — Encounter: Payer: Self-pay | Admitting: Family Medicine

## 2019-10-21 MED ORDER — CELECOXIB 50 MG PO CAPS
50.0000 mg | ORAL_CAPSULE | Freq: Two times a day (BID) | ORAL | 1 refills | Status: DC | PRN
Start: 2019-10-21 — End: 2023-11-12

## 2019-11-03 ENCOUNTER — Encounter: Payer: Self-pay | Admitting: Family Medicine

## 2019-11-03 ENCOUNTER — Other Ambulatory Visit: Payer: Self-pay

## 2019-11-03 ENCOUNTER — Ambulatory Visit: Payer: Self-pay

## 2019-11-03 ENCOUNTER — Ambulatory Visit: Payer: BC Managed Care – PPO | Admitting: Family Medicine

## 2019-11-03 DIAGNOSIS — M25562 Pain in left knee: Secondary | ICD-10-CM

## 2019-11-03 NOTE — Progress Notes (Signed)
   Office Visit Note   Patient: Tracy Beard           Date of Birth: March 31, 1960           MRN: [id number] Visit Date: 11/03/2019 Requested by: Jarrett Soho, PA-C [address] [place],  [place] [address] PCP: Jarrett Soho, PA-C  Subjective: Chief Complaint  Patient presents with  . Left Knee - Pain    Celebrex was not helping the knee. Requesting a cortisone injection.    HPI: She is here with persistent left knee pain.  Pain is mostly on the posterior lateral aspect.  Celebrex has not helped.              ROS:   All other systems were reviewed and are negative.  Objective: Vital Signs: There were no vitals taken for this visit.  Physical Exam:  General:  Alert and oriented, in no acute distress. Pulm:  Breathing unlabored. Psy:  Normal mood, congruent affect. Skin: No erythema Left knee: 1+ effusion with no warmth.  She is tender posterior laterally, but I do not palpate a definite cyst.  Imaging: US Guided Needle Placement  Result Date: 11/03/2019 Limited diagnostic ultrasound of the left posterior knee does not reveal a popliteal cyst.   Assessment & Plan: 1.  Persistent left posterior lateral knee pain, suspicious for degenerative lateral meniscus tear. -Discussed options with her and elected to inject with cortisone.  If this does not help, then MRI scan.  If this helps a lot but she has pain in the future, could contemplate gel injections.     Procedures: Left knee steroid injection: After sterile prep with Betadine, injected 3 cc 1% lidocaine without epinephrine and 40 mg methylprednisolone from lateral midpatellar approach.  A flash of clear yellow synovial fluid was obtained prior to injection.    PMFS History: Patient Active Problem List   Diagnosis Date Noted  . Vitamin D deficiency 02/04/2018  . Depression 02/04/2018  . Essential hypertension 11/20/2017  . Class 3 severe obesity with serious comorbidity and body mass  index (BMI) of 40.0 to 44.9 in adult ([facility]) 11/20/2017  . Serum potassium elevated 03/11/2017  . Chronic kidney disease 03/11/2017  . Insulin resistance 03/11/2017  . Other fatigue 02/25/2017  . Shortness of breath on exertion 02/25/2017  . Prediabetes 02/25/2017   Past Medical History:  Diagnosis Date  . Acid reflux   . HTN (hypertension)     Family History  Problem Relation Age of Onset  . Hypertension Mother   . Hypertension Father     History reviewed. No pertinent surgical history. Social History   Occupational History  . Occupation: Principal  Tobacco Use  . Smoking status: Former Smoker    Packs/day: 1.00    Years: 24.00    Pack years: 24.00    Quit date: 1997    Years since quitting: 24.7  . Smokeless tobacco: Never Used  Vaping Use  . Vaping Use: Never used  Substance and Sexual Activity  . Alcohol use: No  . Drug use: No  . Sexual activity: Yes    Partners: Female    Birth control/protection: None

## 2019-11-11 ENCOUNTER — Other Ambulatory Visit: Payer: Self-pay

## 2019-11-11 ENCOUNTER — Encounter: Payer: Self-pay | Admitting: Family Medicine

## 2019-11-11 ENCOUNTER — Ambulatory Visit: Payer: Self-pay

## 2019-11-11 ENCOUNTER — Ambulatory Visit: Payer: BC Managed Care – PPO | Admitting: Family Medicine

## 2019-11-11 DIAGNOSIS — M25562 Pain in left knee: Secondary | ICD-10-CM

## 2019-11-11 NOTE — Progress Notes (Signed)
I saw and examined the patient with Dr. Marga Hoots and agree with assessment and plan as outlined.    Recurrent left knee pain.  With wedding this weekend, will try one more injection.  Also ordered MRI.

## 2019-11-11 NOTE — Progress Notes (Signed)
Office Visit Note   Patient: Tracy Beard           Date of Birth: [birth year]           MRN: [id number] Visit Date: 11/11/2019 Requested by: Jarrett Soho, PA-C [address] [place],  [place] [address] PCP: Jarrett Soho, PA-C  Subjective: Chief Complaint  Patient presents with  . Left Knee - Pain    HPI: 59yo F F presenting to clinic with concerns of recurrent Left knee pain. Patient underwent a steroid injection on her Left knee last week, with concern for OA and possible degenerative meniscal injury. She says that initially the steroid job did an excellent job of controlling her symptoms- however she tripped when she was getting out of her trunk and twisted her knee under her. This injury was two days ago. She isn't sure if her knee swelled after the incident, but does that that it was significantly painful. 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 She is hoping to get another steroid shot today in the hopes of once again getting good relief.               ROS:   All other systems were reviewed and are negative.  Objective: Vital Signs: There were no vitals taken for this visit.  Physical Exam:  General:  Alert and oriented, in no acute distress. Pulm:  Breathing unlabored. Psy:  Normal mood, congruent affect. Skin:  Left knee skin intact. Small (0.5cm diameter) bruise in lateral mid-patellar area from previous injection.   Left knee: Angaltic gait favoring left knee.  Trace effusion.  Tenderness to palpation over both medial and lateral joint lines, though worse in the lateral aspect. Significant pain with ligamentous testing, though no obvious laxity.    Imaging: Left Knee XR Mild Degenerative changes appreciated, though no obvious Fractures or dislocations.   Assessment & Plan: 59yo F presenting to clinic with acute on chronic left knee pain. Patient with twisting injury  of her knee shortly after steroid shot last week, and hoping for a second shot today. Risks of back-to-back steroid shots discussed, but patient willing to proceed due to social/occupational obligations.  -Injection performed as described below, which patient tolerated well. Voiced immediate improvement in her symptoms.  -Will Order MRI to evaluate for possible meniscal injury -Patient with no further questions or concerns today.      Procedures: Left Knee Cortisone Injection:  Risks and benefits of procedure discussed, Patient opted to proceed. Verbal Consent obtained.  Timeout performed.  Skin prepped in a sterile fashion with betadine before further cleansing with alcohol. Ethyl Chloride was used for topical analgesia.  Left Knee was injected with 5cc 1% Lidocaine without epinephrine via the suprapatellar approach using a 25G, 1.5in needle. Syringe was removed from the needle, and 6mg  betamethasone was then injected into the joint.   Patient tolerated the injection well with no immediate complications. Aftercare instructions were discussed, and patient was given strict return precautions.      PMFS History: Patient Active Problem List   Diagnosis Date Noted  . Vitamin D deficiency 02/04/2018  . Depression 02/04/2018  . Essential hypertension 11/20/2017  . Class 3 severe obesity with serious comorbidity and body mass index (BMI) of 40.0 to 44.9 in adult ([facility]) 11/20/2017  . Serum potassium elevated 03/11/2017  . Chronic kidney disease 03/11/2017  . Insulin resistance 03/11/2017  . Other  fatigue 02/25/2017  . Shortness of breath on exertion 02/25/2017  . Prediabetes 02/25/2017   Past Medical History:  Diagnosis Date  . Acid reflux   . HTN (hypertension)     Family History  Problem Relation Age of Onset  . Hypertension Mother   . Hypertension Father     History reviewed. No pertinent surgical history. Social History   Occupational History  . Occupation: Principal    Tobacco Use  . Smoking status: Former Smoker    Packs/day: 1.00    Years: 24.00    Pack years: 24.00    Quit date: 1997    Years since quitting: 24.7  . Smokeless tobacco: Never Used  Vaping Use  . Vaping Use: Never used  Substance and Sexual Activity  . Alcohol use: No  . Drug use: No  . Sexual activity: Yes    Partners: Female    Birth control/protection: None

## 2019-11-30 ENCOUNTER — Ambulatory Visit
Admission: RE | Admit: 2019-11-30 | Discharge: 2019-11-30 | Disposition: A | Discharge status: Home / Self Care | Payer: BC Managed Care – PPO | Source: Ambulatory Visit | Attending: Family Medicine | Admitting: Family Medicine

## 2019-11-30 ENCOUNTER — Other Ambulatory Visit: Payer: Self-pay

## 2019-11-30 DIAGNOSIS — M25562 Pain in left knee: Secondary | ICD-10-CM

## 2019-12-01 ENCOUNTER — Telehealth: Payer: Self-pay | Admitting: Family Medicine

## 2019-12-01 NOTE — Telephone Encounter (Signed)
Spoke with patient. Appt scheduled

## 2019-12-01 NOTE — Telephone Encounter (Signed)
MRI confirms a tear of the medial meniscus cartilage.  There is some arthritis behind the kneecap as well.

## 2019-12-03 ENCOUNTER — Telehealth: Payer: Self-pay

## 2019-12-03 ENCOUNTER — Ambulatory Visit: Payer: BC Managed Care – PPO | Admitting: Orthopedic Surgery

## 2019-12-03 DIAGNOSIS — M25562 Pain in left knee: Secondary | ICD-10-CM | POA: Diagnosis not present

## 2019-12-03 NOTE — Telephone Encounter (Signed)
Noted  

## 2019-12-03 NOTE — Telephone Encounter (Signed)
Can we please get auth for left knee gel injection? 

## 2019-12-06 ENCOUNTER — Encounter: Payer: Self-pay | Admitting: Orthopedic Surgery

## 2019-12-06 NOTE — Progress Notes (Addendum)
Office Visit Note   Patient: Tracy Beard           Date of Birth: May 18, 1960           MRN: [id number] Visit Date: 12/03/2019 Requested by: Jarrett Soho, PA-C [address] [place],  [place] [address] PCP: Jarrett Soho, PA-C  Subjective: Chief Complaint  Patient presents with  . Left Knee - Pain    HPI: Tracy Beard is a 59 year old patient with left knee pain. Has had pain since 09/08/2019. Unsure of injury. Cortisone has been done twice with marginal relief. 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 Reports pain in the back of the knee as well as anteriorly. Has had gradual onset of pain. Started about 2 months ago and then 2 weeks after initial onset of pain she felt a pop. No discrete injury. 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 MRI scan is reviewed and it shows radial tear of the posterior meniscal root without definite meniscal root avulsion. No extrusion of the meniscus and not much arthritis in the medial compartment. There is a ruptured Baker's cyst which could be accounting for some of her posterior pain.              ROS: All systems reviewed are negative as they relate to the chief complaint within the history of present illness.  Patient denies  fevers or chills.   Assessment & Plan: Visit Diagnoses:  1. Acute pain of left knee     Plan: Impression is left knee pain with radial root tear posterior horn medial meniscus without definite meniscal root evulsion or extrusion of the meniscus. Ruptured Baker's cyst is also present which could be accounting for some of her posterior pain. Patient is 5 1 with weight of 237 which puts her in a high risk group for failure with either meniscal repair or arthroplasty. At this time based on pain is her primary symptoms and lack of meniscal extrusion as well as careful review of the MRI scan demonstrating absence  of ghost sign for meniscal root evulsion I think conservative treatment is indicated. Plan to preapproved for gel injection and see how she does. Hard to say if she is at a place also with her job where she could be nonweightbearing for a period of 6 weeks with with which would be required if she did undergo any type of meniscal repair.   This patient is diagnosed with osteoarthritis of the knee(s).    Radiographs show evidence of joint space narrowing, osteophytes, subchondral sclerosis and/or subchondral cysts.  This patient has knee pain which interferes with functional and activities of daily living.    This patient has experienced inadequate response, adverse effects and/or intolerance with conservative treatments such as acetaminophen, NSAIDS, topical creams, physical therapy or regular exercise, knee bracing and/or weight loss.   This patient has experienced inadequate response or has a contraindication to intra articular steroid injections for at least 3 months.   This patient is not scheduled to have a total knee replacement within 6 months of starting treatment with viscosupplementation.  Follow-Up Instructions: No follow-ups on file.   Orders:  No orders of the defined types were placed in this encounter.  No orders of the defined types were placed in this encounter.     Procedures: No procedures performed   Clinical Data: No additional findings.  Objective: Vital Signs: There were  no vitals taken for this visit.  Physical Exam:   Constitutional: Patient appears well-developed HEENT:  Head: Normocephalic Eyes:EOM are normal Neck: Normal range of motion Cardiovascular: Normal rate Pulmonary/chest: Effort normal Neurologic: Patient is alert Skin: Skin is warm Psychiatric: Patient has normal mood and affect    Ortho Exam: Ortho exam demonstrates slightly antalgic gait to the left. No knee effusion today. Not much medial or lateral ligament laxity which would  make technically any type of meniscal root repair difficult. Extensor mechanism is intact. No palpable masses in the posterior aspect of the left knee. Extensor mechanism nontender. No definite focal medial or lateral joint line tenderness.  Specialty Comments:  No specialty comments available.  Imaging: No results found.   PMFS History: Patient Active Problem List   Diagnosis Date Noted  . Vitamin D deficiency 02/04/2018  . Depression 02/04/2018  . Essential hypertension 11/20/2017  . Class 3 severe obesity with serious comorbidity and body mass index (BMI) of 40.0 to 44.9 in adult ([facility]) 11/20/2017  . Serum potassium elevated 03/11/2017  . Chronic kidney disease 03/11/2017  . Insulin resistance 03/11/2017  . Other fatigue 02/25/2017  . Shortness of breath on exertion 02/25/2017  . Prediabetes 02/25/2017   Past Medical History:  Diagnosis Date  . Acid reflux   . HTN (hypertension)     Family History  Problem Relation Age of Onset  . Hypertension Mother   . Hypertension Father     History reviewed. No pertinent surgical history. Social History   Occupational History  . Occupation: Principal  Tobacco Use  . Smoking status: Former Smoker    Packs/day: 1.00    Years: 24.00    Pack years: 24.00    Quit date: 1997    Years since quitting: 24.8  . Smokeless tobacco: Never Used  Vaping Use  . Vaping Use: Never used  Substance and Sexual Activity  . Alcohol use: No  . Drug use: No  . Sexual activity: Yes    Partners: Female    Birth control/protection: None

## 2020-01-01 ENCOUNTER — Telehealth: Payer: Self-pay

## 2020-01-01 NOTE — Telephone Encounter (Signed)
VOB submitted for SynviscOne, left knee  

## 2020-01-08 ENCOUNTER — Telehealth: Payer: Self-pay

## 2020-01-08 NOTE — Telephone Encounter (Signed)
Pt's insurance requires PA-paperwork completed and faxed to insurance company

## 2020-01-15 ENCOUNTER — Telehealth: Payer: Self-pay

## 2020-01-15 NOTE — Telephone Encounter (Signed)
Approved for Synvisc One, left knee. Buy & Bill Patient is covered at 100% after Co-pay Co-pay may be between $94.00- $188.00 PA required  PA Approval# [id number] Valid 01/08/2020 -07/06/2020  Patient stated that she is doing very well with her knee and will give [place] a call back when she is ready to proceed.  Benefits will need to be reverified if patient calls back after the first of the year.

## 2020-05-06 ENCOUNTER — Other Ambulatory Visit: Payer: Self-pay

## 2020-05-06 ENCOUNTER — Ambulatory Visit (INDEPENDENT_AMBULATORY_CARE_PROVIDER_SITE_OTHER): Payer: BC Managed Care – PPO | Admitting: Family Medicine

## 2020-05-06 DIAGNOSIS — M1712 Unilateral primary osteoarthritis, left knee: Secondary | ICD-10-CM

## 2020-05-06 DIAGNOSIS — M1711 Unilateral primary osteoarthritis, right knee: Secondary | ICD-10-CM | POA: Diagnosis not present

## 2020-05-06 NOTE — Progress Notes (Signed)
   Office Visit Note   Patient: Tracy Beard           Date of Birth: 1960/10/10           MRN: [id number] Visit Date: 05/06/2020 Requested by: Jarrett Soho, PA-C [address] [place],  [place] [address] PCP: Jarrett Soho, PA-C  Subjective: Chief Complaint  Patient presents with  . Left Knee - Follow-up    Planned Synvisc One injection    HPI: She is here for a planned left knee Synvisc 1 injection.  She also states that her right knee has been hurting a lot and she would like to have a cortisone injection on that side.              ROS:   All other systems were reviewed and are negative.  Objective: Vital Signs: There were no vitals taken for this visit.  Physical Exam:  General:  Alert and oriented, in no acute distress. Pulm:  Breathing unlabored. Psy:  Normal mood, congruent affect.  Knees: Trace effusion bilaterally with no warmth or erythema.  Imaging: No results found.  Assessment & Plan: 1.  Left knee osteoarthritis -Synvisc 1 injection given today.  Follow-up as needed.  2.  Right knee osteoarthritis -Steroid injection given today.     Procedures: Bilateral knee injection: After sterile prep with Betadine, injected 3 cc 0.25% bupivacaine and Synvisc 1 from lateral midpatellar approach left knee, and 40 mg Depo-Medrol from lateral midpatellar approach right knee.  A flash of synovial fluid was obtained prior to each injection.       PMFS History: Patient Active Problem List   Diagnosis Date Noted  . Vitamin D deficiency 02/04/2018  . Depression 02/04/2018  . Essential hypertension 11/20/2017  . Class 3 severe obesity with serious comorbidity and body mass index (BMI) of 40.0 to 44.9 in adult (HCC) 11/20/2017  . Serum potassium elevated 03/11/2017  . Chronic kidney disease 03/11/2017  . Insulin resistance 03/11/2017  . Other fatigue 02/25/2017  . Shortness of breath on exertion 02/25/2017  . Prediabetes 02/25/2017    Past Medical History:  Diagnosis Date  . Acid reflux   . HTN (hypertension)     Family History  Problem Relation Age of Onset  . Hypertension Mother   . Hypertension Father     No past surgical history on file. Social History   Occupational History  . Occupation: Principal  Tobacco Use  . Smoking status: Former Smoker    Packs/day: 1.00    Years: 24.00    Pack years: 24.00    Quit date: 1997    Years since quitting: 25.2  . Smokeless tobacco: Never Used  Vaping Use  . Vaping Use: Never used  Substance and Sexual Activity  . Alcohol use: No  . Drug use: No  . Sexual activity: Yes    Partners: Female    Birth control/protection: None

## 2021-08-15 ENCOUNTER — Ambulatory Visit: Payer: Self-pay

## 2021-08-15 ENCOUNTER — Ambulatory Visit (INDEPENDENT_AMBULATORY_CARE_PROVIDER_SITE_OTHER): Payer: BC Managed Care – PPO | Admitting: Orthopaedic Surgery

## 2021-08-15 DIAGNOSIS — M1711 Unilateral primary osteoarthritis, right knee: Secondary | ICD-10-CM | POA: Diagnosis not present

## 2021-08-15 DIAGNOSIS — M1712 Unilateral primary osteoarthritis, left knee: Secondary | ICD-10-CM | POA: Diagnosis not present

## 2021-08-15 DIAGNOSIS — M170 Bilateral primary osteoarthritis of knee: Secondary | ICD-10-CM

## 2021-08-15 MED ORDER — METHYLPREDNISOLONE ACETATE 40 MG/ML IJ SUSP
40.0000 mg | INTRAMUSCULAR | Status: AC | PRN
  Administered 2021-08-15: 40 mg via INTRA_ARTICULAR

## 2021-08-15 MED ORDER — LIDOCAINE HCL 1 % IJ SOLN
2.0000 mL | INTRAMUSCULAR | Status: AC | PRN
  Administered 2021-08-15: 2 mL

## 2021-08-15 MED ORDER — BUPIVACAINE HCL 0.5 % IJ SOLN
2.0000 mL | INTRAMUSCULAR | Status: AC | PRN
  Administered 2021-08-15: 2 mL via INTRA_ARTICULAR

## 2021-08-15 NOTE — Progress Notes (Signed)
Office Visit Note   Patient: Tracy Beard           Date of Birth: Jan 11, 1961           MRN: [id number] Visit Date: 08/15/2021              Requested by: Jarrett Soho, PA-C [address] [place],  [place] [address] PCP: Jarrett Soho, PA-C   Assessment & Plan: Visit Diagnoses:  1. Unilateral primary osteoarthritis, left knee   2. Unilateral primary osteoarthritis, right knee     Plan: Impression is bilateral knee osteoarthritis.  Both knees were injected with cortisone today.  We will also go ahead and get approval for Synvisc injections as a Synvisc has decreased her requirement for pain medication and has had good sustained relief.  Follow-Up Instructions: No follow-ups on file.   Orders:  Orders Placed This Encounter  Procedures   XR KNEE 3 VIEW LEFT   XR KNEE 3 VIEW RIGHT   No orders of the defined types were placed in this encounter.     Procedures: Large Joint Inj: bilateral knee on 08/15/2021 4:35 PM Indications: pain Details: 22 G needle  Arthrogram: No  Medications (Right): 2 mL lidocaine 1 %; 2 mL bupivacaine 0.5 %; 40 mg methylPREDNISolone acetate 40 MG/ML Medications (Left): 2 mL lidocaine 1 %; 2 mL bupivacaine 0.5 %; 40 mg methylPREDNISolone acetate 40 MG/ML Outcome: tolerated well, no immediate complications Patient was prepped and draped in the usual sterile fashion.       Clinical Data: No additional findings.   Subjective: Chief Complaint  Patient presents with   Right Knee - Pain   Left Knee - Pain    HPI Tracy Beard returns today for bilateral knee pain.  Last saw Dr. Prince Rome in April 2022 for osteoarthritis.  Left knee was injected with Synvisc and the right knee was injected with steroids.  She has had great relief from these injections since then.  Would like them to be repeated today. Review of Systems   Objective: Vital Signs: There were no vitals taken for this visit.  Physical Exam  Ortho  Exam Bilateral knee exams are unchanged. Specialty Comments:  No specialty comments available.  Imaging: No results found.   PMFS History: Patient Active Problem List   Diagnosis Date Noted   Vitamin D deficiency 02/04/2018   Depression 02/04/2018   Essential hypertension 11/20/2017   Class 3 severe obesity with serious comorbidity and body mass index (BMI) of 40.0 to 44.9 in adult (HCC) 11/20/2017   Serum potassium elevated 03/11/2017   Chronic kidney disease 03/11/2017   Insulin resistance 03/11/2017   Other fatigue 02/25/2017   Shortness of breath on exertion 02/25/2017   Prediabetes 02/25/2017   Past Medical History:  Diagnosis Date   Acid reflux    HTN (hypertension)     Family History  Problem Relation Age of Onset   Hypertension Mother    Hypertension Father     No past surgical history on file. Social History   Occupational History   Occupation: Principal  Tobacco Use   Smoking status: Former    Packs/day: 1.00    Years: 24.00    Total pack years: 24.00    Types: Cigarettes    Quit date: 1997    Years since quitting: 26.5   Smokeless tobacco: Never  Vaping Use   Vaping Use: Never used  Substance and Sexual Activity   Alcohol use: No   Drug use: No  Sexual activity: Yes    Partners: Female    Birth control/protection: None

## 2021-11-23 IMAGING — MR MR KNEE*L* W/O CM
4 of 6 series · 22 of 40 positions shown · non-contrast
Comparison: Radiographs 11/11/2019

CLINICAL DATA: Knee pain and swelling since August 2019

EXAM:
MRI OF THE LEFT KNEE WITHOUT CONTRAST
TECHNIQUE: Multiplanar, multisequence MR imaging of the knee was performed. No
intravenous contrast was administered.

[Series 3: T2 fat-sat · axial · 4.0mm · 0.50mm/px · z∈[-34,+61]mm · 5 of 24 slices shown (1 of 2)]
[im 1/24]
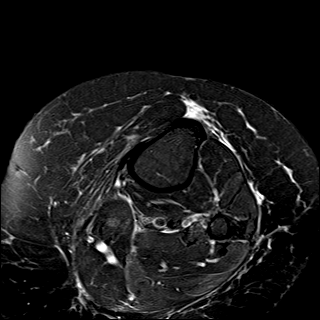
[im 4/24]
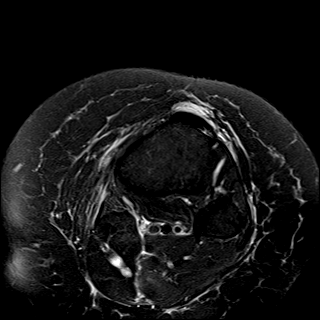
[im 8/24]
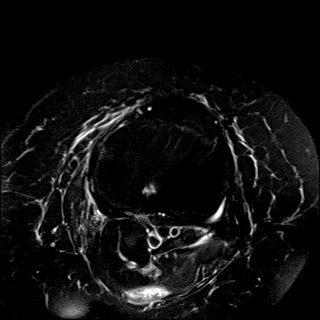
[im 12/24]
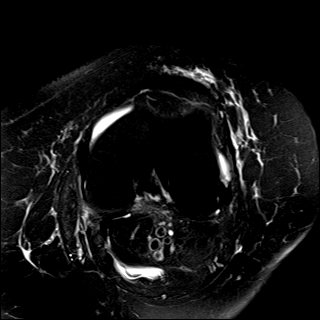
[im 20/24]
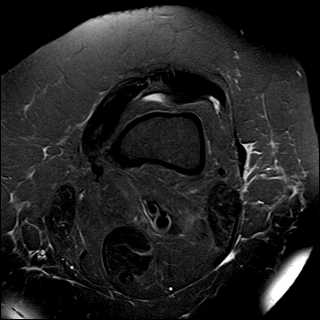

[Series 5: T2 fat-sat · coronal · 4.0mm · 0.29mm/px · 3 of 24 slices shown (2 of 2)]
[im 5/24]
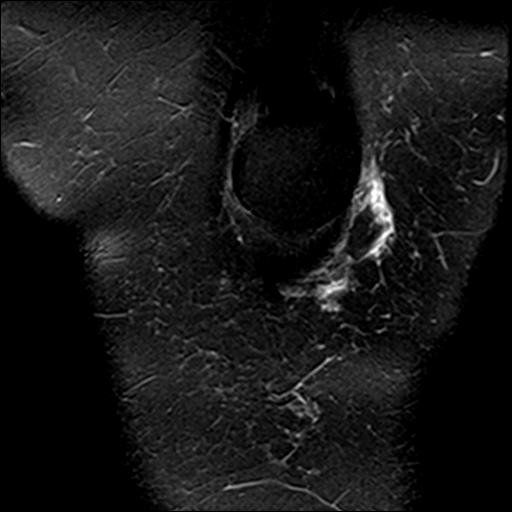
[im 14/24]
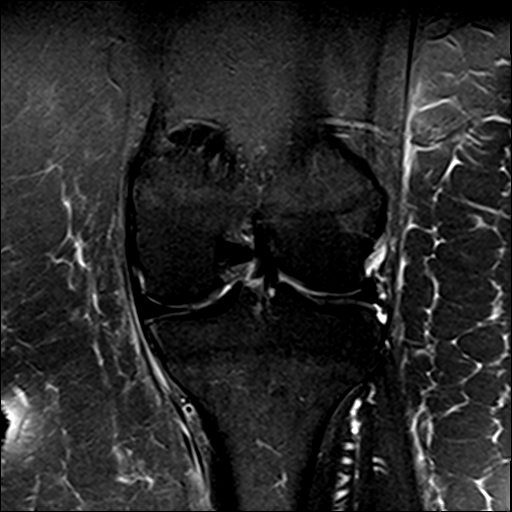
[im 24/24]
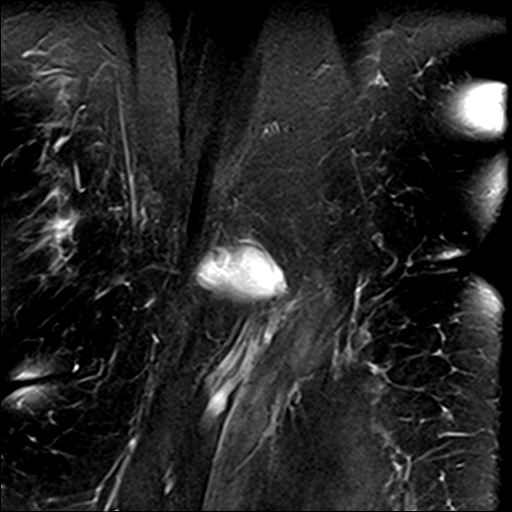

[Series 7: PD fat-sat · sagittal · 3.0mm · 0.29mm/px · 7 of 27 slices shown (1 of 2)]
[im 1/27]
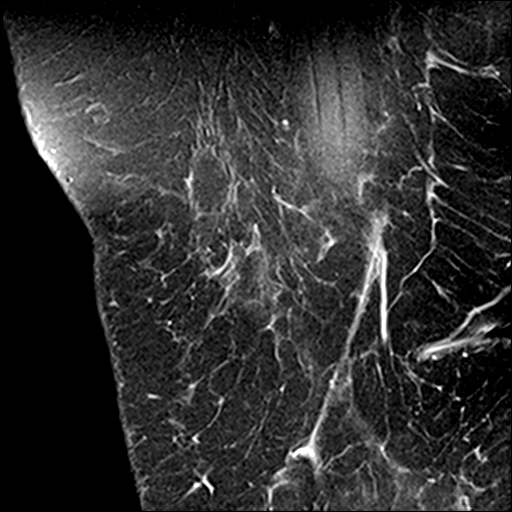
[im 5/27]
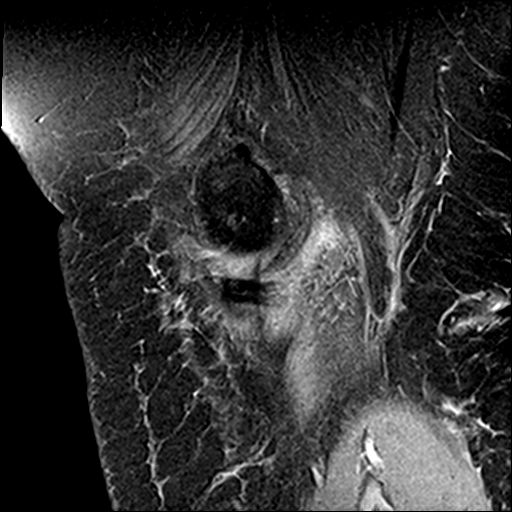
[im 9/27]
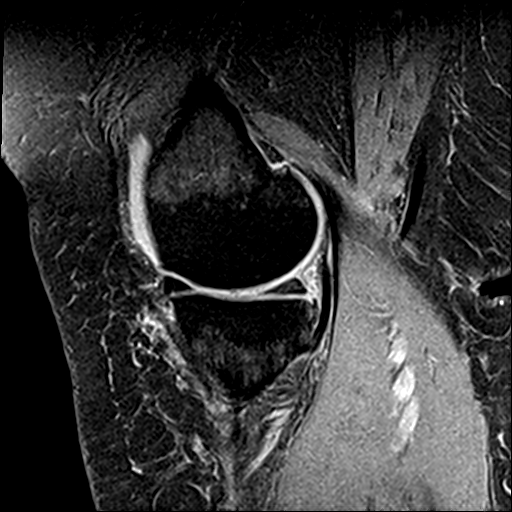
[im 14/27]
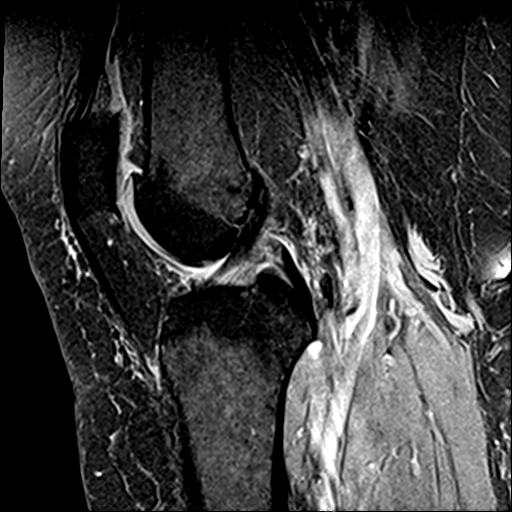
[im 18/27]
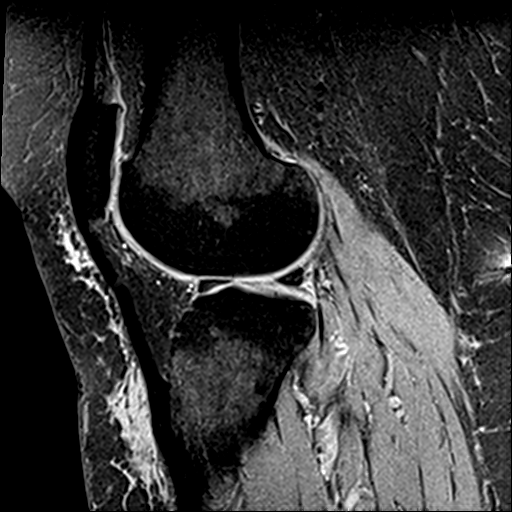
[im 22/27]
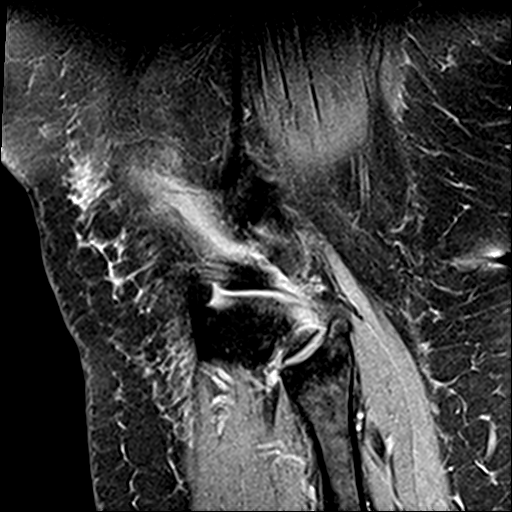
[im 27/27]
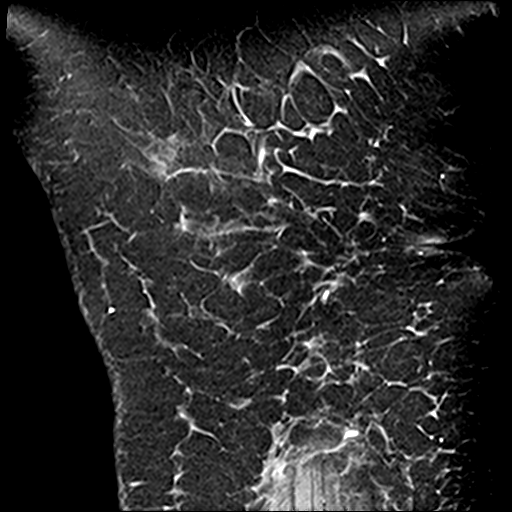

[Series 8: PD fat-sat · coronal · 3.0mm · 0.29mm/px · 7 of 28 slices shown (2 of 2)]
[im 1/28]
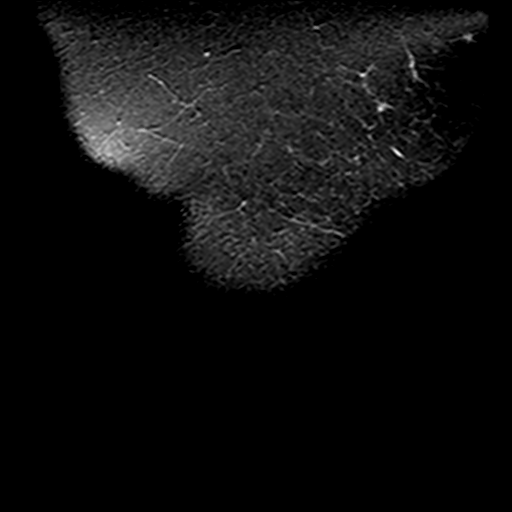
[im 5/28]
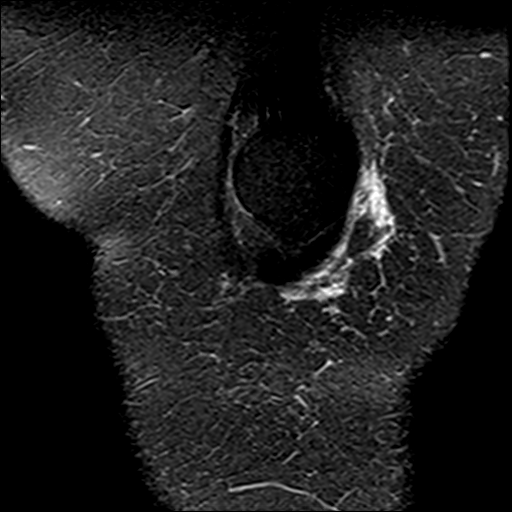
[im 10/28]
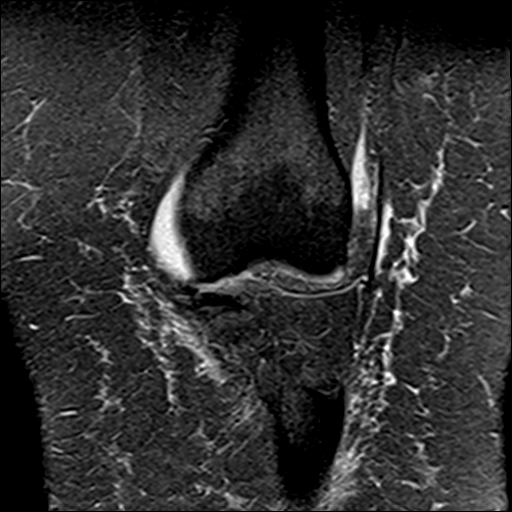
[im 14/28]
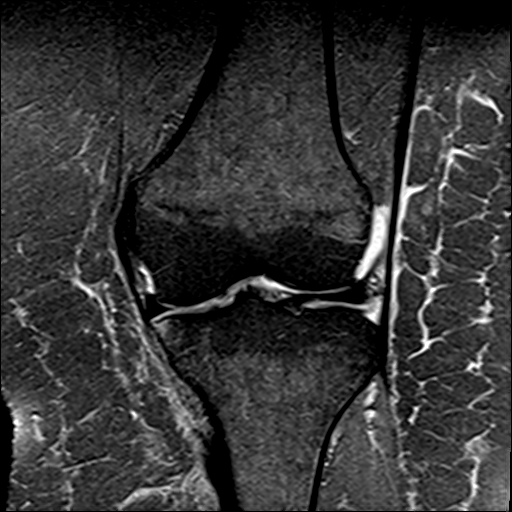
[im 19/28]
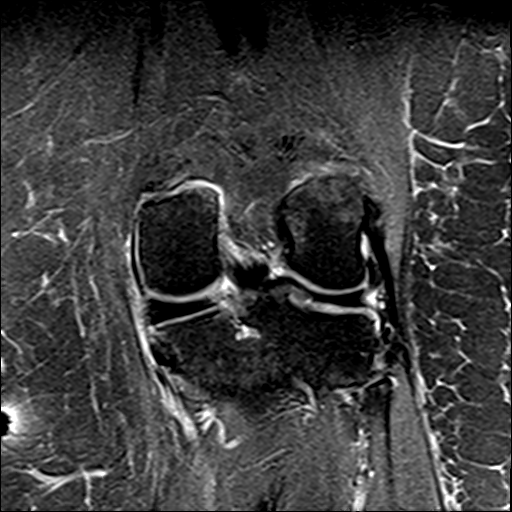
[im 23/28]
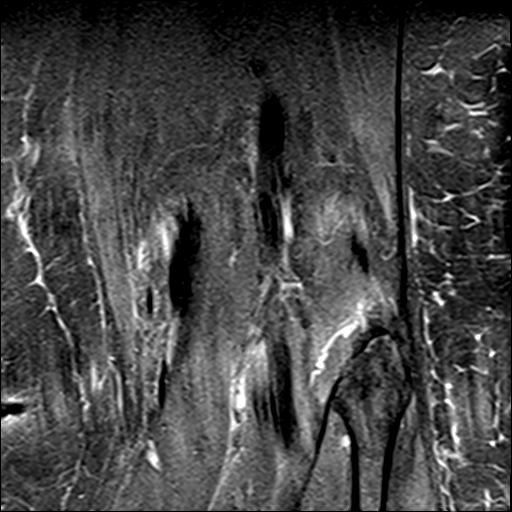
[im 28/28]
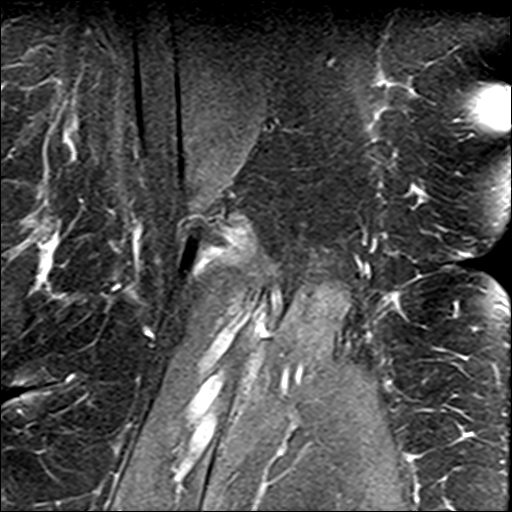

[22 of 40 positions shown; findings below may reference images not displayed]

FINDINGS: MENISCI

Medial meniscus: Radial tear of the root of the posterior horn,
images 16-18 of series 7.

Lateral meniscus: Grade 1 signal in the posterior horn lateral
meniscus near the meniscal root.

LIGAMENTS

Cruciates:  Unremarkable

Collaterals:  Unremarkable

CARTILAGE

Patellofemoral: Moderate to prominent degenerative chondral thinning
inferiorly along the lateral patellar facet. Otherwise mild
degenerative chondral thinning with mild marginal spurring.

Medial:  Mild degenerative chondral thinning with marginal spurring.

Lateral: Mild degenerative chondral thinning with marginal spurring.

Joint:  Small knee effusion.

Popliteal Fossa:  Small likely ruptured Baker's cyst.

Extensor Mechanism: Subcutaneous edema anterior to the patellar
tendon and lateral patellar retinaculum/iliotibial band.

Bones:  Small geode posteriorly along the tibial spine.

Other: No supplemental non-categorized findings.
IMPRESSION: 1. Radial tear of the root of the posterior horn medial meniscus.
2. Small knee effusion with small likely ruptured Baker's cyst.
3. Osteoarthritis, most notable in the patellofemoral joint.

## 2022-06-22 ENCOUNTER — Ambulatory Visit: Payer: BC Managed Care – PPO | Admitting: Orthopaedic Surgery

## 2022-06-25 DIAGNOSIS — M25571 Pain in right ankle and joints of right foot: Secondary | ICD-10-CM | POA: Insufficient documentation

## 2022-06-25 NOTE — Progress Notes (Unsigned)
Office Visit Note   Patient: Tracy Beard           Date of Birth: 09/26/1960           MRN: [id number] Visit Date: 06/26/2022              Requested by: Jarrett Soho, PA-C [address] [place],  [place] [address] PCP: Jarrett Soho, PA-C   Assessment & Plan: Visit Diagnoses:  1. Pain in right ankle and joints of right foot     Plan: Impression is chronic lateral ankle pain.  Sinus Tarsi injection was performed today which the patient tolerated well.  Activity modification and Tylenol as needed.  Follow-up as needed.  Follow-Up Instructions: No follow-ups on file.   Orders:  Orders Placed This Encounter  Procedures   XR Ankle Complete Right   No orders of the defined types were placed in this encounter.     Procedures: Medium Joint Inj: R ankle on 06/26/2022 3:58 PM Indications: pain Details: 25 G needle Medications: 1 mL lidocaine 1 %; 40 mg methylPREDNISolone acetate 40 MG/ML; 1 mL bupivacaine 0.5 % Outcome: tolerated well, no immediate complications Patient was prepped and draped in the usual sterile fashion.       Clinical Data: No additional findings.   Subjective: Chief Complaint  Patient presents with   Right Ankle - Pain    HPI Tracy Beard is a 61 year old female here for lateral right ankle pain off and on for a year.  Denies any injuries.  Feels like every few days ankle catches and pops.  She has noticed that this is happening more frequently.  She is taking Tylenol as needed.  Athletic tape helps but irritates her skin.  An ankle brace does not help. Review of Systems  Constitutional: Negative.   HENT: Negative.    Eyes: Negative.   Respiratory: Negative.    Cardiovascular: Negative.   Endocrine: Negative.   Musculoskeletal: Negative.   Neurological: Negative.   Hematological: Negative.   Psychiatric/Behavioral: Negative.    All other systems reviewed and are negative.    Objective: Vital Signs: There were no  vitals taken for this visit.  Physical Exam Vitals and nursing note reviewed.  Constitutional:      Appearance: She is well-developed.  HENT:     Head: Normocephalic and atraumatic.  Pulmonary:     Effort: Pulmonary effort is normal.  Abdominal:     Palpations: Abdomen is soft.  Musculoskeletal:     Cervical back: Neck supple.  Skin:    General: Skin is warm.     Capillary Refill: Capillary refill takes less than 2 seconds.  Neurological:     Mental Status: She is alert and oriented to person, place, and time.  Psychiatric:        Behavior: Behavior normal.        Thought Content: Thought content normal.        Judgment: Judgment normal.    Ortho Exam Examination of right ankle shows normal range of motion of the ankle joint and subtalar joint.  She has tenderness to the sinus Tarsi. Specialty Comments:  No specialty comments available.  Imaging: XR Ankle Complete Right  Result Date: 06/26/2022 X-rays demonstrate mild periarticular spurring of the ankle joint.  Mild spurring of the lateral talar process.    PMFS History: Patient Active Problem List   Diagnosis Date Noted   Pain in right ankle and joints of right foot 06/25/2022   Vitamin D deficiency  02/04/2018   Depression 02/04/2018   Essential hypertension 11/20/2017   Class 3 severe obesity with serious comorbidity and body mass index (BMI) of 40.0 to 44.9 in adult (HCC) 11/20/2017   Serum potassium elevated 03/11/2017   Chronic kidney disease 03/11/2017   Insulin resistance 03/11/2017   Other fatigue 02/25/2017   Shortness of breath on exertion 02/25/2017   Prediabetes 02/25/2017   Past Medical History:  Diagnosis Date   Acid reflux    HTN (hypertension)     Family History  Problem Relation Age of Onset   Hypertension Mother    Hypertension Father     No past surgical history on file. Social History   Occupational History   Occupation: Principal  Tobacco Use   Smoking status: Former     Packs/day: 1.00    Years: 24.00    Additional pack years: 0.00    Total pack years: 24.00    Types: Cigarettes    Quit date: 1997    Years since quitting: 27.4   Smokeless tobacco: Never  Vaping Use   Vaping Use: Never used  Substance and Sexual Activity   Alcohol use: No   Drug use: No   Sexual activity: Yes    Partners: Female    Birth control/protection: None

## 2022-06-26 ENCOUNTER — Ambulatory Visit: Payer: BC Managed Care – PPO | Admitting: Orthopaedic Surgery

## 2022-06-26 ENCOUNTER — Other Ambulatory Visit (INDEPENDENT_AMBULATORY_CARE_PROVIDER_SITE_OTHER): Payer: BC Managed Care – PPO

## 2022-06-26 DIAGNOSIS — M25571 Pain in right ankle and joints of right foot: Secondary | ICD-10-CM

## 2022-06-26 MED ORDER — LIDOCAINE HCL 1 % IJ SOLN
1.0000 mL | INTRAMUSCULAR | Status: AC | PRN
  Administered 2022-06-26: 1 mL

## 2022-06-26 MED ORDER — BUPIVACAINE HCL 0.5 % IJ SOLN
1.0000 mL | INTRAMUSCULAR | Status: AC | PRN
  Administered 2022-06-26: 1 mL via INTRA_ARTICULAR

## 2022-06-26 MED ORDER — METHYLPREDNISOLONE ACETATE 40 MG/ML IJ SUSP
40.0000 mg | INTRAMUSCULAR | Status: AC | PRN
  Administered 2022-06-26: 40 mg via INTRA_ARTICULAR

## 2023-01-01 ENCOUNTER — Encounter: Payer: Self-pay | Admitting: Physician Assistant

## 2023-01-01 ENCOUNTER — Ambulatory Visit: Payer: BC Managed Care – PPO | Admitting: Physician Assistant

## 2023-01-01 DIAGNOSIS — M7989 Other specified soft tissue disorders: Secondary | ICD-10-CM | POA: Diagnosis not present

## 2023-01-01 DIAGNOSIS — M79605 Pain in left leg: Secondary | ICD-10-CM | POA: Diagnosis not present

## 2023-01-01 NOTE — Progress Notes (Signed)
Office Visit Note   Patient: Tracy Beard           Date of Birth: [birth year]           MRN: [id number] Visit Date: 01/01/2023              Requested by: Jarrett Soho, PA-C [address] [place],  [place] [address] PCP: Jarrett Soho, PA-C  Chief Complaint  Patient presents with   Left Knee - Pain      HPI: Tracy Beard is a pleasant 62 year old woman who is followed by Dr. Roda Shutters.  She has a history of arthritis in both of her knees.  She periodically gets injections which seems to help.  She said she was out chopping wood last week and after that began developing more pain on the lateral side of her left leg no specific swelling but the pain radiates down into her calf.  Denies any shortness of breath fever chills does have some radiation proximally 2.  Assessment & Plan: Visit Diagnoses:  1. Pain and swelling of lower extremity, left     Plan: Had a conversation with her today certainly some of this could be related to an exacerbation in her arthritis of the IT band tightness.  However she does have some calf pain and some increased pain with Denna Haggard' sign being equivocal.  I told her I like to rule this out she will get an ultrasound knows to go to the ER if anything further develops.  Could also be a Baker's cyst that is ruptured causing the symptoms.  Follow-Up Instructions: No follow-ups on file.   Ortho Exam  Patient is alert, oriented, no adenopathy, well-dressed, normal affect, normal respiratory effort. Examination of the left knee no effusion no erythema compartments are soft she does have some tenderness over the lateral and posterior lateral calf.  She does have a little increased tenderness in the calf with dorsiflexion passively of her ankle.  No tenderness in her hip or in her groin.  No pain with rotation of her hip.  She is neurovascularly strong and intact  Imaging: No results found. No images are attached to the encounter.  Labs: Lab  Results  Component Value Date   HGBA1C 5.3 04/07/2018   HGBA1C 5.3 10/21/2017   HGBA1C 5.3 07/11/2017     Lab Results  Component Value Date   ALBUMIN 4.6 04/07/2018   ALBUMIN 4.4 10/21/2017   ALBUMIN 4.1 07/11/2017    Lab Results  Component Value Date   MG 1.9 02/25/2017   Lab Results  Component Value Date   VD25OH 62.8 04/07/2018   VD25OH 59.6 10/21/2017   VD25OH 58.9 07/11/2017    No results found for: "PREALBUMIN"    Latest Ref Rng & Units 02/25/2017    9:38 AM  CBC EXTENDED  WBC 3.4 - 10.8 x10E3/uL 9.5   RBC 3.77 - 5.28 x10E6/uL 4.16   Hemoglobin 11.1 - 15.9 g/dL 86.5   HCT 78.4 - 69.6 % 38.3   NEUT# 1.4 - 7.0 x10E3/uL 6.7   Lymph# 0.7 - 3.1 x10E3/uL 2.1      There is no height or weight on file to calculate BMI.  Orders:  Orders Placed This Encounter  Procedures   VAS [place] LOWER EXTREMITY VENOUS (DVT)   No orders of the defined types were placed in this encounter.    Procedures: No procedures performed  Clinical Data: No additional findings.  ROS:  All other systems negative, except as noted in  the HPI. Review of Systems  Objective: Vital Signs: There were no vitals taken for this visit.  Specialty Comments:  No specialty comments available.  PMFS History: Patient Active Problem List   Diagnosis Date Noted   Pain in right ankle and joints of right foot 06/25/2022   Vitamin D deficiency 02/04/2018   Depression 02/04/2018   Essential hypertension 11/20/2017   Class 3 severe obesity with serious comorbidity and body mass index (BMI) of 40.0 to 44.9 in adult (HCC) 11/20/2017   Serum potassium elevated 03/11/2017   Chronic kidney disease 03/11/2017   Insulin resistance 03/11/2017   Other fatigue 02/25/2017   Shortness of breath on exertion 02/25/2017   Prediabetes 02/25/2017   Past Medical History:  Diagnosis Date   Acid reflux    HTN (hypertension)     Family History  Problem Relation Age of Onset   Hypertension Mother     Hypertension Father     No past surgical history on file. Social History   Occupational History   Occupation: Principal  Tobacco Use   Smoking status: Former    Current packs/day: 0.00    Average packs/day: 1 pack/day for 24.0 years (24.0 ttl pk-yrs)    Types: Cigarettes    Start date: 78    Quit date: 1997    Years since quitting: 27.9   Smokeless tobacco: Never  Vaping Use   Vaping status: Never Used  Substance and Sexual Activity   Alcohol use: No   Drug use: No   Sexual activity: Yes    Partners: Female    Birth control/protection: None

## 2023-01-02 ENCOUNTER — Ambulatory Visit (HOSPITAL_COMMUNITY)
Admission: RE | Admit: 2023-01-02 | Discharge: 2023-01-02 | Disposition: A | Discharge status: Home / Self Care | Payer: BC Managed Care – PPO | Source: Ambulatory Visit | Attending: Physician Assistant | Admitting: Physician Assistant

## 2023-01-02 DIAGNOSIS — M7989 Other specified soft tissue disorders: Secondary | ICD-10-CM | POA: Insufficient documentation

## 2023-01-02 DIAGNOSIS — M79605 Pain in left leg: Secondary | ICD-10-CM | POA: Insufficient documentation

## 2023-01-04 ENCOUNTER — Encounter: Payer: Self-pay | Admitting: Orthopaedic Surgery

## 2023-06-03 ENCOUNTER — Encounter: Payer: Self-pay | Admitting: Podiatry

## 2023-06-03 ENCOUNTER — Ambulatory Visit (INDEPENDENT_AMBULATORY_CARE_PROVIDER_SITE_OTHER): Admitting: Podiatry

## 2023-06-03 ENCOUNTER — Ambulatory Visit: Admitting: Podiatry

## 2023-06-03 DIAGNOSIS — M722 Plantar fascial fibromatosis: Secondary | ICD-10-CM | POA: Diagnosis not present

## 2023-06-03 NOTE — Progress Notes (Signed)
 Chief Complaint  Patient presents with   Foot Orthotics    Rm 10/ Pt want orthotics/in b/l heel/1/10    Physical exam:  General appearance: Pleasant, and in no acute distress. AOx3.  Vascular: Pedal pulses: DP palpable laterally, PT palpable bilaterally.  Severe edema lower legs bilaterally. Capillary fill time immediate.  Neurologica Light touch intact feet bilaterally.  Normal Achilles reflex bilaterally.  No clonus or spasticity noted.  No paresthesias.  No burning lower extremity bilaterally.  Negative Tinel's sign tarsal tunnel bilaterally.  Negative Tinel's sign porta pedis-.  Negative Tinel's sign tarsal sural nerve bilaterally. Vibratory sensation intact bilaterally.  Dermatologic:   Nails normal 1-5 BL. Skin normal temperature bilaterally.  Skin normal color, tone, and texture bilaterally.   Musculoskeletal: Some tenderness along the medial arch and medial band of the plantar.  No tenderness to the medial calcaneal tubercle.  No posterior heel pain.  No fibromas noted.  Radiographs: None  Diagnosis: Plantar fasciitis  Plan: -Established office visit level 3. - Will get set up for custom foot orthoses bilaterally -Discussed proper shoe gear. - Continue exercises from PT as she has been doing.  Return as needed right so   No follow-ups on file.

## 2023-06-28 ENCOUNTER — Telehealth: Payer: Self-pay

## 2023-06-28 NOTE — Telephone Encounter (Signed)
 LVM to schedule orthotic pick up

## 2023-07-03 ENCOUNTER — Other Ambulatory Visit

## 2023-08-07 ENCOUNTER — Ambulatory Visit (INDEPENDENT_AMBULATORY_CARE_PROVIDER_SITE_OTHER)

## 2023-08-07 DIAGNOSIS — M2141 Flat foot [pes planus] (acquired), right foot: Secondary | ICD-10-CM

## 2023-08-07 DIAGNOSIS — M722 Plantar fascial fibromatosis: Secondary | ICD-10-CM

## 2023-08-07 NOTE — Progress Notes (Signed)
 Patient presents today to pick up custom molded foot orthotics, diagnosed with pf by Dr. Christine.   Orthotics were dispensed and fit was satisfactory. Reviewed instructions for break-in and wear. Written instructions given to patient.  Patient will follow up as needed.   Lolita Schultze CPed, CFo, CFm

## 2023-11-12 ENCOUNTER — Ambulatory Visit: Admitting: Podiatry

## 2023-11-12 DIAGNOSIS — B070 Plantar wart: Secondary | ICD-10-CM | POA: Diagnosis not present

## 2023-11-12 NOTE — Progress Notes (Signed)
 Patient presents with complaint of a painful lesion on the plantar aspect of the left foot has been ongoing for several months and seems like it getting worse.  Has never noticed it before.  Does not recall any injury to the area.   Physical exam:  General appearance: Pleasant, and in no acute distress. AOx3.  Vascular: Pedal pulses: DP 2/4 bilaterally, PT 2/4 bilaterally. Mild edema lower legs bilaterally. Capillary fill time immediate bilaterally.  Neurological: Grossly intact bilaterally  Dermatologic:   Skin normal temperature bilaterally.  Skin normal color, tone, and texture bilaterally.   Musculoskeletal:    Diagnosis: 1.  Plantar verruca left Plan: -Discussed verruca's lesions.  Discussed viral etiology.  Discussed other possible diagnoses. -Applied Salinocaine compound to lesion(s) as noted in physical exam after debriding lesions to pinpoint bleeding.  Salinocaine applied to lesion(s) and covered with an occlusive dressing with Coban wrap.  Written and oral instructions given to patient.    Return 2 weeks follow-up plantar verruca right

## 2023-11-26 ENCOUNTER — Ambulatory Visit: Admitting: Podiatry

## 2023-11-26 ENCOUNTER — Encounter: Payer: Self-pay | Admitting: Podiatry

## 2023-11-26 DIAGNOSIS — B070 Plantar wart: Secondary | ICD-10-CM | POA: Diagnosis not present

## 2023-11-26 NOTE — Progress Notes (Signed)
 Presents today follow-up verrucous lesion left foot.  Doing much better with very little discomfort toe wart site  Physical exam:  General appearance: Pleasant, and in no acute distress. AOx3.  Vascular: Pedal pulses: DP 2/4 bilaterally, PT 2/4 bilaterally.  Moderate edema lower legs bilaterally. Capillary fill time immediate bilaterally.  Neurological: Grossly intact bilaterally  Dermatologic:   Skin normal temperature bilaterally.  Skin normal color, tone, and texture bilaterally.   Musculoskeletal:  Diagnosis: 1.  Plantar verruca left foot.  Plan: -Verrucous lesion considerably improved.  Will try second application of Salinocaine. -Applied Salinocaine compound to lesion(s) as noted in physical exam after debriding lesions to pinpoint bleeding.  Salinocaine applied to lesion(s) and covered with an occlusive dressing with Coban wrap.  Written and oral instructions given to patient. -Return in 2 weeks for follow-up and reevaluation.   Return 2 weeks follow-up plantar verruca to left

## 2023-12-02 ENCOUNTER — Encounter: Payer: Self-pay | Admitting: Radiology

## 2023-12-10 ENCOUNTER — Ambulatory Visit: Admitting: Podiatry

## 2023-12-10 ENCOUNTER — Encounter: Payer: Self-pay | Admitting: Podiatry

## 2023-12-10 DIAGNOSIS — M7752 Other enthesopathy of left foot: Secondary | ICD-10-CM | POA: Diagnosis not present

## 2023-12-10 NOTE — Progress Notes (Signed)
 Presents today follow-up plantar verruca with pain left foot.  Cannot feel any lesion although the area is still tender.  Sometimes hurts when she steps on it.   Physical exam:  General appearance: Pleasant, and in no acute distress. AOx3.  Vascular: Pedal pulses: DP 2/4 bilaterally, PT 2/4 bilaterally. Moderate edema lower legs bilaterally. Capillary fill time immediate b/l.  Neurological: Grossly intact bilaterally  Dermatologic:   Verrucous lesion resolved.  Normal skin lines no pinpoint hemorrhages.  No defined lesion visible skin normal temperature bilaterally.    Musculoskeletal: Tenderness plantar lateral aspect of the fourth MTP left.  No tenderness with range of motion of the fifth MTP left.  Small click palpable pop probably if there is inflamed bursa or thickened plantar lateral digital nerve.  Diagnosis: 1.  Bursitis left foot  Plan: -Established office visit for evaluation and management.  Level 3 - Verrucous lesion appears to be resolved with normal skin and no signs of a lesion.  Told her to watch for any signs of recurrence I think she does have some residual inflammation of the bursa with a nerve or possibly the joint capsule at the fifth MTP and the pressure was being placed on it because of the verrucous lesion -Voltaren gel as needed for inflammation at the fifth MTP left.  She can also try icing as needed   Return as needed
